# Patient Record
Sex: Female | Born: 1955
Health system: Southern US, Community
[De-identification: ages and names within clinical notes are randomized; demographics above are authoritative.]

## PROBLEM LIST (undated history)

## (undated) DIAGNOSIS — I10 Essential (primary) hypertension: Secondary | ICD-10-CM

## (undated) DIAGNOSIS — H269 Unspecified cataract: Secondary | ICD-10-CM

## (undated) DIAGNOSIS — E785 Hyperlipidemia, unspecified: Secondary | ICD-10-CM

## (undated) DIAGNOSIS — Z9889 Other specified postprocedural states: Secondary | ICD-10-CM

## (undated) DIAGNOSIS — C801 Malignant (primary) neoplasm, unspecified: Secondary | ICD-10-CM

## (undated) DIAGNOSIS — R112 Nausea with vomiting, unspecified: Secondary | ICD-10-CM

## (undated) DIAGNOSIS — Z923 Personal history of irradiation: Secondary | ICD-10-CM

## (undated) DIAGNOSIS — T4145XA Adverse effect of unspecified anesthetic, initial encounter: Secondary | ICD-10-CM

## (undated) DIAGNOSIS — T8859XA Other complications of anesthesia, initial encounter: Secondary | ICD-10-CM

## (undated) HISTORY — DX: Unspecified cataract: H26.9

## (undated) HISTORY — DX: Hyperlipidemia, unspecified: E78.5

## (undated) HISTORY — PX: MULTIPLE TOOTH EXTRACTIONS: SHX2053

---

## 2002-05-02 ENCOUNTER — Ambulatory Visit (HOSPITAL_COMMUNITY): Admission: RE | Admit: 2002-05-02 | Discharge: 2002-05-02 | Payer: Self-pay | Admitting: Family Medicine

## 2002-05-02 ENCOUNTER — Encounter: Payer: Self-pay | Admitting: Family Medicine

## 2003-01-17 ENCOUNTER — Emergency Department (HOSPITAL_COMMUNITY): Admission: EM | Admit: 2003-01-17 | Discharge: 2003-01-17 | Payer: Self-pay | Admitting: Emergency Medicine

## 2005-02-07 ENCOUNTER — Ambulatory Visit (HOSPITAL_COMMUNITY): Admission: RE | Admit: 2005-02-07 | Discharge: 2005-02-07 | Payer: Self-pay | Admitting: Family Medicine

## 2006-01-24 HISTORY — PX: FRACTURE SURGERY: SHX138

## 2006-10-11 ENCOUNTER — Ambulatory Visit (HOSPITAL_COMMUNITY): Admission: RE | Admit: 2006-10-11 | Discharge: 2006-10-11 | Payer: Self-pay | Admitting: Family Medicine

## 2006-10-11 ENCOUNTER — Ambulatory Visit (HOSPITAL_COMMUNITY): Admission: RE | Admit: 2006-10-11 | Discharge: 2006-10-11 | Payer: Self-pay | Admitting: Orthopaedic Surgery

## 2006-10-14 ENCOUNTER — Inpatient Hospital Stay (HOSPITAL_COMMUNITY): Admission: RE | Admit: 2006-10-14 | Discharge: 2006-10-16 | Payer: Self-pay | Admitting: Orthopedic Surgery

## 2010-06-08 NOTE — Op Note (Signed)
NAMEARMINDA, FOGLIO              ACCOUNT NO.:  [id number]   MEDICAL RECORD NO.:  [id number]          PATIENT TYPE:  OIB   LOCATION:  1604                         FACILITY:  [facility]   PHYSICIAN:  Ollen Gross, M.D.    DATE OF BIRTH:  Feb 22, 1955   DATE OF PROCEDURE:  10/13/2006  DATE OF DISCHARGE:                               OPERATIVE REPORT   PREOPERATIVE DIAGNOSIS:  Right medial tibial plateau fracture.   POSTOPERATIVE DIAGNOSIS:  Right medial tibial plateau fracture.   PROCEDURE:  Open reduction and internal fixation of tibial plateau  fracture, right.   SURGEON:  Ollen Gross, M.D.   ASSISTANT:  Alexzandrew L. Perkins, P.A.C.   ANESTHESIA:  General.   ESTIMATED BLOOD LOSS:  Minimal.   DRAINS:  Hemovac x1.   TOURNIQUET TIME:  91 minutes at 300 mmHg.   COMPLICATIONS:  None.   CONDITION:  Stable to recovery.   BRIEF CLINICAL NOTE:  Ms. Kowal is a 55 year old female who had a fall  approximately two days ago with immediate pain and deformity of her  right knee.  She was seen at [facility] where x-rays and CT  scan showed medial side tibial plateau fracture.  I saw her in the  office yesterday and she is prepared for surgery today for open  reduction and internal fixation with bone grafting.   PROCEDURE IN DETAIL:  After successful administration of general  anesthetic, a tourniquet was placed high on the right thigh and the  right lower extremity prepped and draped in the usual sterile fashion.  A longitudinal incision is made starting at the mid patella coursing  distally over the tibial tubercle.  The skin is cut with a 10 blade  through subcutaneous tissue to the level of the extensor mechanism.  I  cut just medial to the patellar tendon to inspect the joint but did not  cut the meniscus and subperiosteally elevated the periosteum and soft  tissue of the proximal medial tibia to the posterior aspect.  I then  identified the depressed portion of the  fracture and through a small  open window in the cortex, was able to elevate the depressed portion and  we packed about 10 mL of cancellus graft mixed with the Symphony  platelet rich gel.  This effectively restored the joint line.  Then, I  attempted to place an Ace locking plate, but the contour of the plate  did not anywhere near match the contour of the proximal medial tibia,  even new using the left sided plate which would have been the left  lateral to hope to match the contour of the right medial but it did not  match it well.  We thus used the Synthes three hole locking plate and it  better matched the contour.  Proximally, we placed two locking screws  which helped to buttress up the elevated portion of the tibia.  Distally, I placed a cortical screw from medial to lateral and also  placed another locking screw.  This had great reduction of the fracture  and looked anatomic on multiple views on the  x-rays.  We placed it  through a range of motion and I was able to palpate the joint line,  there was no depression at the joint.  I then copiously irrigated the  joint and the wound with saline solution.  The periosteum was closed  over the plate with interrupted #1 Vicryl.  The tourniquet was released  with a total time of 91 minutes.  The deep tissues were closed with  interrupted #1 Vicryl, subcu with interrupted 2-0 Vicryl, and the skin  with staples.  A Hemovac drain had been placed and it was hooked to  suction.  A bulky sterile dressing was applied.  She was placed into a  knee immobilizer, awakened, and transferred to recovery in stable  condition.      Ollen Gross, M.D.  Electronically Signed     FA/MEDQ  D:  10/13/2006  T:  10/14/2006  Job:  [id number]

## 2010-06-11 NOTE — H&P (Signed)
Meredith Donaldson, Meredith Donaldson              ACCOUNT NO.:  [id number]   MEDICAL RECORD NO.:  [id number]          PATIENT TYPE:  INP   LOCATION:  1604                         FACILITY:  [facility]   PHYSICIAN:  Ollen Gross, M.D.    DATE OF BIRTH:  1955-03-29   DATE OF ADMISSION:  10/13/2006  DATE OF DISCHARGE:  10/16/2006                              HISTORY & PHYSICAL   CHIEF COMPLAINT:  Right leg pain.   HISTORY OF PRESENT ILLNESS:  The patient is a 55 year old who had a fall  two days prior to admission, immediate pain and deformity of the right  knee.  She was seen at [facility] where x-rays and CT scan,  found to have medial side tibial plateau.  She was seen in the office on  the day prior to admission by Dr. Lequita Halt.  Planned for surgery and  admitted for ORIF tibial plateau fracture.   ALLERGIES:  SULFA and PENICILLIN.   CURRENT MEDICATIONS:  Ziac, Colace, K-Dur.   PAST HISTORY:  1. Hypertension.  2. Arthritis.  3. Anemia.  4. Tension headaches.   PAST SURGICAL HISTORY:  Noncontributory.   FAMILY HISTORY:  Negative.   SOCIAL HISTORY:  Does not use tobacco products or alcohol products.   REVIEW OF SYSTEMS:  GENERAL:  No fever, chills, night sweats.  NEUROLOGIC:  No seizures, syncope or paralysis.  RESPIRATORY:  No  shortness breath, productive cough or hemoptysis.  CARDIOVASCULAR:  No  chest pain, angina or orthopnea.  GI:  No nausea, vomiting, diarrhea,  constipation.  GU: No dysuria, hematuria or discharge. MUSCULOSKELETAL:  Right leg.   PHYSICAL EXAMINATION:  VITAL SIGNS:  Temperature 98.2, pulse 57,  respiration 12, blood pressure 124/60.  GENERAL:  A 55 year old female well-nourished, well-developed, no acute  distress, mild distress secondary to right leg pain.  HEENT:  Normocephalic, atraumatic.  Pupils round and reactive.  Notable  glasses.  EOMs intact.  NECK:  Supple.  CHEST: Clear.  HEART: Regular rhythm.  No murmur, S1-S2.  ABDOMEN:  Soft, nontender.   Bowel sounds present.  RECTAL/BREASTS/GENITALIA:  Not done, not pertinent to present illness.  EXTREMITIES:  Right leg noted to be in a knee immobilizer secondary to  fracture.  Motor function is intact.  Moved foot and toes well on exam.  Swelling in the knee.   IMPRESSION:  Right medial tibial plateau fracture.   PLAN:  The patient admitted to [facility] to undergo a ORIF of  the right tibia plateau.  Surgery will be performed by Dr. Ollen Gross.      Alexzandrew L. Perkins, P.A.C.      Ollen Gross, M.D.  Electronically Signed    ALP/MEDQ  D:  11/14/2006  T:  11/14/2006  Job:  [id number]   cc:   Chart

## 2010-06-11 NOTE — Discharge Summary (Signed)
Meredith Donaldson, Meredith Donaldson              ACCOUNT NO.:  [id number]   MEDICAL RECORD NO.:  [id number]          PATIENT TYPE:  INP   LOCATION:  1604                         [facility]ACILITY:  [facility]   PHYSICIAN:  Ollen Gross, M.D.    DATE OF BIRTH:  [birth year]   DATE OF ADMISSION:  10/13/2006  DATE OF DISCHARGE:  10/16/2006                               DISCHARGE SUMMARY   ADMITTING DIAGNOSES:  1. Right medial tibial plateau fracture.  2. Hypertension.  3. Arthritis.  4. History of anemia.  5. Tension headaches.   DISCHARGE DIAGNOSES:  1. Right medial tibial plateau fracture status post open reduction and      internal fixation right tibia plateau.  2. Hypertension.  3. Arthritis.  4. History of anemia.  5. Tension headaches.  6. Postoperative blood loss anemia.  7. Mild hypokalemia, improved.  8. Mild postoperative hyponatremia, improved.   PROCEDURE:  October 13, 2006, ORIF right tibial plateau.  Surgeon:  Dr. Lequita Halt.  Assistant:  Avel Peace PA-C.  Anesthesia:  General.   BRIEF HISTORY:  Meredith Donaldson is a 55 year old female who had a fall  approximately two days ago, seen at [facility], x-ray and CT  scan, was seen in the office at follow up and planned for surgery.   LABORATORY DATA:  Preop H&H 12.1 and 35.3.  Serial CBCs were followed.  Hemoglobin did drop to 9.6, last noted 8.9 and 25.7.  PT and PTT on  admission 14.4 and 37, respectively, INR 1.1.  Serial protimes followed.  Last noted PT/INR 15.1 and 1.2.  BMET on admission with low potassium at  3.3, remaining BMP within normal limits.  Serial BMETs were followed.  Potassium came up to 4.1, sodium dropped from 137 to 132 and back up to  137.   EKG October 13, 2006, normal sinus rhythm, unconfirmed.   A two-view knee October 13, 2006, status post placement of __________  screw hardware for reduction of medial tibial plateau.  Two-view chest  October 13, 2006, no active cardiopulmonary disease.   HOSPITAL  COURSE:  The patient was admitted to [facility] and  taken OR, underwent above stated procedure without complication.  The  patient tolerated the procedure well, later transferred to the recovery  room and orthopedic floor, started on PCA and p.o. analgesic pain  control following surgery. Status improved well on the morning of day  #1, comfortable with PCA.  She was weaned over p.o. meds.  Started a CPM  for passive range of motion.  She had a little bit of hypokalemia on  admission.  She was given p.o. supplementation.  Started getting up out  of bed by day #2.  She was starting around a little bit better with  therapy.  She was maintaining her touchdown weightbearing.  She started  on Coumadin protocol.  She had a little bit of drop in her sodium that  was felt to be due to the fluids.  Potassium still a little low.  We  discontinued her fluids.  She was on Lovenox along with Coumadin.  Started getting up and around.  By day #3, she was getting up better  with PT, walking short distances maintaining her touchdown weightbearing  and she was ready go home.  Hemoglobin was a little low so we sent her  home on some iron.  She was not quite therapeutic, so we sent home on  Lovenox and Coumadin.   DISCHARGE/PLAN:  1. The patient discharged home on October 16, 2006.  2. Discharge diagnoses:  Please see above.  3. Discharge medications:  Vicodin, Robaxin, Nu-Iron Lovenox Coumadin.   DIET:  As tolerated.   ACTIVITY:  She is touchdown weightbearing right leg.  May start  showering.  Do not submerge the incision under water.  Home health PT,  home health nursing, Coumadin protocol.   FOLLOW-UP:  Next week on October 24, 2006, with Dr. Lequita Halt.  Call for  an appointment time.   DISPOSITION:  Home.   CONDITION ON DISCHARGE:  Improving.      Alexzandrew L. Perkins, P.A.C.      Ollen Gross, M.D.  Electronically Signed    ALP/MEDQ  D:  11/14/2006  T:  11/14/2006   Job:  [id number]   cc:   Chart

## 2010-11-04 LAB — PROTIME-INR
INR: 1.1
INR: 1.1
INR: 1.2
INR: 1.2
Prothrombin Time: 14.4
Prothrombin Time: 14.6
Prothrombin Time: 15.1
Prothrombin Time: 15.7 — ABNORMAL HIGH

## 2010-11-04 LAB — CBC
HCT: 25.7 — ABNORMAL LOW
HCT: 27.5 — ABNORMAL LOW
Hemoglobin: 8.9 — ABNORMAL LOW
Hemoglobin: 9.6 — ABNORMAL LOW
MCHC: 34.6
MCHC: 35
MCV: 93.4
MCV: 93.7
Platelets: 153
Platelets: 172
RBC: 2.74 — ABNORMAL LOW
RBC: 2.95 — ABNORMAL LOW
RDW: 12.2
RDW: 12.3
WBC: 10.1
WBC: 10.9 — ABNORMAL HIGH

## 2010-11-04 LAB — BASIC METABOLIC PANEL
BUN: 10
BUN: 3 — ABNORMAL LOW
BUN: 6
BUN: 8
CO2: 27
CO2: 30
CO2: 30
CO2: 31
Calcium: 8.4
Calcium: 8.4
Calcium: 8.7
Calcium: 9.2
Chloride: 100
Chloride: 101
Chloride: 101
Chloride: 97
Creatinine, Ser: 0.85
Creatinine, Ser: 0.89
Creatinine, Ser: 0.89
Creatinine, Ser: 0.99
GFR calc Af Amer: >60
GFR calc Af Amer: >60
GFR calc Af Amer: >60
GFR calc Af Amer: >60
GFR calc non Af Amer: 59 — ABNORMAL LOW
GFR calc non Af Amer: >60
GFR calc non Af Amer: >60
GFR calc non Af Amer: >60
Glucose, Bld: 110 — ABNORMAL HIGH
Glucose, Bld: 118 — ABNORMAL HIGH
Glucose, Bld: 132 — ABNORMAL HIGH
Glucose, Bld: 136 — ABNORMAL HIGH
Potassium: 3.3 — ABNORMAL LOW
Potassium: 3.4 — ABNORMAL LOW
Potassium: 3.4 — ABNORMAL LOW
Potassium: 4.1
Sodium: 132 — ABNORMAL LOW
Sodium: 136
Sodium: 137
Sodium: 137

## 2010-11-04 LAB — HEMOGLOBIN AND HEMATOCRIT, BLOOD
HCT: 35.3 — ABNORMAL LOW
Hemoglobin: 12.1

## 2010-11-04 LAB — APTT: aPTT: 37

## 2011-10-20 ENCOUNTER — Other Ambulatory Visit (HOSPITAL_COMMUNITY): Payer: Self-pay | Admitting: Family Medicine

## 2011-10-20 ENCOUNTER — Ambulatory Visit (HOSPITAL_COMMUNITY)
Admission: RE | Admit: 2011-10-20 | Discharge: 2011-10-20 | Disposition: A | Discharge status: Home / Self Care | Payer: Managed Care, Other (non HMO) | Source: Ambulatory Visit | Attending: Family Medicine | Admitting: Family Medicine

## 2011-10-20 DIAGNOSIS — M76899 Other specified enthesopathies of unspecified lower limb, excluding foot: Secondary | ICD-10-CM

## 2011-10-20 DIAGNOSIS — M949 Disorder of cartilage, unspecified: Secondary | ICD-10-CM | POA: Insufficient documentation

## 2011-10-20 DIAGNOSIS — M25569 Pain in unspecified knee: Secondary | ICD-10-CM | POA: Insufficient documentation

## 2011-10-20 DIAGNOSIS — M899 Disorder of bone, unspecified: Secondary | ICD-10-CM | POA: Insufficient documentation

## 2014-09-17 ENCOUNTER — Other Ambulatory Visit (HOSPITAL_COMMUNITY): Payer: Self-pay | Admitting: Physician Assistant

## 2014-09-17 DIAGNOSIS — Z139 Encounter for screening, unspecified: Secondary | ICD-10-CM

## 2014-09-24 ENCOUNTER — Ambulatory Visit (HOSPITAL_COMMUNITY)
Admission: RE | Admit: 2014-09-24 | Discharge: 2014-09-24 | Disposition: A | Discharge status: Home / Self Care | Payer: BLUE CROSS/BLUE SHIELD | Source: Ambulatory Visit | Attending: Physician Assistant | Admitting: Physician Assistant

## 2014-09-24 DIAGNOSIS — Z139 Encounter for screening, unspecified: Secondary | ICD-10-CM

## 2014-09-24 DIAGNOSIS — Z1231 Encounter for screening mammogram for malignant neoplasm of breast: Secondary | ICD-10-CM | POA: Diagnosis present

## 2015-06-19 ENCOUNTER — Ambulatory Visit (HOSPITAL_COMMUNITY)
Admission: RE | Admit: 2015-06-19 | Discharge: 2015-06-19 | Disposition: A | Discharge status: Home / Self Care | Payer: BLUE CROSS/BLUE SHIELD | Source: Ambulatory Visit | Attending: Family Medicine | Admitting: Family Medicine

## 2015-06-19 ENCOUNTER — Other Ambulatory Visit (HOSPITAL_COMMUNITY): Payer: Self-pay | Admitting: Family Medicine

## 2015-06-19 DIAGNOSIS — S8991XA Unspecified injury of right lower leg, initial encounter: Secondary | ICD-10-CM | POA: Diagnosis not present

## 2015-06-19 DIAGNOSIS — M25561 Pain in right knee: Secondary | ICD-10-CM | POA: Diagnosis not present

## 2015-06-19 DIAGNOSIS — Z6838 Body mass index (BMI) 38.0-38.9, adult: Secondary | ICD-10-CM | POA: Diagnosis not present

## 2015-06-19 DIAGNOSIS — Z1389 Encounter for screening for other disorder: Secondary | ICD-10-CM | POA: Diagnosis not present

## 2015-08-25 DIAGNOSIS — M25561 Pain in right knee: Secondary | ICD-10-CM | POA: Diagnosis not present

## 2015-11-20 DIAGNOSIS — Z23 Encounter for immunization: Secondary | ICD-10-CM | POA: Diagnosis not present

## 2016-01-09 ENCOUNTER — Emergency Department (HOSPITAL_COMMUNITY)
Admission: EM | Admit: 2016-01-09 | Discharge: 2016-01-09 | Disposition: A | Discharge status: Home / Self Care | Payer: BLUE CROSS/BLUE SHIELD | Attending: Emergency Medicine | Admitting: Emergency Medicine

## 2016-01-09 ENCOUNTER — Encounter (HOSPITAL_COMMUNITY): Payer: Self-pay | Admitting: Emergency Medicine

## 2016-01-09 ENCOUNTER — Emergency Department (HOSPITAL_COMMUNITY): Payer: BLUE CROSS/BLUE SHIELD

## 2016-01-09 DIAGNOSIS — I10 Essential (primary) hypertension: Secondary | ICD-10-CM | POA: Insufficient documentation

## 2016-01-09 DIAGNOSIS — Y939 Activity, unspecified: Secondary | ICD-10-CM | POA: Diagnosis not present

## 2016-01-09 DIAGNOSIS — W01198A Fall on same level from slipping, tripping and stumbling with subsequent striking against other object, initial encounter: Secondary | ICD-10-CM | POA: Insufficient documentation

## 2016-01-09 DIAGNOSIS — Y92009 Unspecified place in unspecified non-institutional (private) residence as the place of occurrence of the external cause: Secondary | ICD-10-CM | POA: Insufficient documentation

## 2016-01-09 DIAGNOSIS — J9811 Atelectasis: Secondary | ICD-10-CM

## 2016-01-09 DIAGNOSIS — Z79899 Other long term (current) drug therapy: Secondary | ICD-10-CM | POA: Diagnosis not present

## 2016-01-09 DIAGNOSIS — W19XXXA Unspecified fall, initial encounter: Secondary | ICD-10-CM

## 2016-01-09 DIAGNOSIS — S2231XA Fracture of one rib, right side, initial encounter for closed fracture: Secondary | ICD-10-CM | POA: Diagnosis not present

## 2016-01-09 DIAGNOSIS — S299XXA Unspecified injury of thorax, initial encounter: Secondary | ICD-10-CM | POA: Diagnosis present

## 2016-01-09 DIAGNOSIS — Y999 Unspecified external cause status: Secondary | ICD-10-CM | POA: Diagnosis not present

## 2016-01-09 HISTORY — DX: Essential (primary) hypertension: I10

## 2016-01-09 LAB — COMPREHENSIVE METABOLIC PANEL
ALK PHOS: 54 U/L (ref 38–126)
ALT: 14 U/L (ref 14–54)
AST: 17 U/L (ref 15–41)
Albumin: 3.9 g/dL (ref 3.5–5.0)
Anion gap: 7 (ref 5–15)
BILIRUBIN TOTAL: 1.6 mg/dL — AB (ref 0.3–1.2)
BUN: 12 mg/dL (ref 6–20)
CALCIUM: 9.1 mg/dL (ref 8.9–10.3)
CHLORIDE: 106 mmol/L (ref 101–111)
CO2: 27 mmol/L (ref 22–32)
CREATININE: 0.77 mg/dL (ref 0.44–1.00)
Glucose, Bld: 108 mg/dL — ABNORMAL HIGH (ref 65–99)
Potassium: 3.9 mmol/L (ref 3.5–5.1)
Sodium: 140 mmol/L (ref 135–145)
Total Protein: 7.3 g/dL (ref 6.5–8.1)

## 2016-01-09 LAB — CBC WITH DIFFERENTIAL/PLATELET
BASOS ABS: 0.1 10*3/uL (ref 0.0–0.1)
Basophils Relative: 1 %
EOS PCT: 1 %
Eosinophils Absolute: 0.1 10*3/uL (ref 0.0–0.7)
HEMATOCRIT: 41.3 % (ref 36.0–46.0)
HEMOGLOBIN: 13.8 g/dL (ref 12.0–15.0)
LYMPHS ABS: 2.4 10*3/uL (ref 0.7–4.0)
LYMPHS PCT: 25 %
MCH: 32.8 pg (ref 26.0–34.0)
MCHC: 33.4 g/dL (ref 30.0–36.0)
MCV: 98.1 fL (ref 78.0–100.0)
Monocytes Absolute: 0.7 10*3/uL (ref 0.1–1.0)
Monocytes Relative: 8 %
NEUTROS ABS: 6.5 10*3/uL (ref 1.7–7.7)
Neutrophils Relative %: 67 %
Platelets: 238 10*3/uL (ref 150–400)
RBC: 4.21 MIL/uL (ref 3.87–5.11)
RDW: 13.1 % (ref 11.5–15.5)
WBC: 9.7 10*3/uL (ref 4.0–10.5)

## 2016-01-09 LAB — URINALYSIS, ROUTINE W REFLEX MICROSCOPIC
Bilirubin Urine: NEGATIVE
GLUCOSE, UA: NEGATIVE mg/dL
HGB URINE DIPSTICK: NEGATIVE
Ketones, ur: NEGATIVE mg/dL
LEUKOCYTES UA: NEGATIVE
Nitrite: NEGATIVE
PROTEIN: NEGATIVE mg/dL
SPECIFIC GRAVITY, URINE: 1.008 (ref 1.005–1.030)
pH: 5 (ref 5.0–8.0)

## 2016-01-09 LAB — LACTIC ACID, PLASMA: LACTIC ACID, VENOUS: 0.7 mmol/L (ref 0.5–1.9)

## 2016-01-09 MED ORDER — MORPHINE SULFATE (PF) 4 MG/ML IV SOLN
4.0000 mg | Freq: Once | INTRAVENOUS | Status: AC
  Administered 2016-01-09: 4 mg via INTRAVENOUS
  Filled 2016-01-09: qty 1

## 2016-01-09 MED ORDER — IOPAMIDOL (ISOVUE-300) INJECTION 61%
75.0000 mL | Freq: Once | INTRAVENOUS | Status: AC | PRN
  Administered 2016-01-09: 75 mL via INTRAVENOUS

## 2016-01-09 MED ORDER — OXYCODONE-ACETAMINOPHEN 5-325 MG PO TABS: 1.0000 | ORAL_TABLET | ORAL | 0 refills | Status: DC | PRN

## 2016-01-09 MED ORDER — ONDANSETRON HCL 4 MG PO TABS: 4.0000 mg | ORAL_TABLET | Freq: Three times a day (TID) | ORAL | 0 refills | Status: DC | PRN

## 2016-01-09 NOTE — ED Notes (Signed)
Pt verbalized understanding of no driving and to use caution within 4 hours of taking pain meds due to meds cause drowsiness 

## 2016-01-09 NOTE — ED Triage Notes (Signed)
Pt states she slipped in the bathtub on Tuesday and hit right ribs on side.  Began having pain yesterday afternoon.

## 2016-01-09 NOTE — Discharge Instructions (Signed)
Please take your pain medicine and nausea medicine as needed to control your pain. Please continue taking deep breaths. If you develop any worsening shortness of breath or pain, please return to the nearest emergency department for further management. You need to take deep breaths so you do not develop a pneumonia. He started having fevers or chills or other symptoms, please return. Please follow-up with a primary care physician in several days for further evaluation.

## 2016-01-09 NOTE — ED Provider Notes (Signed)
[facility] DEPT Provider Note   CSN: [id number] Arrival date & time: 01/09/16  0915  By signing my name below, I, Meredith Donaldson, attest that this documentation has been prepared under the direction and in the presence of Courtney Paris, MD. Electronically Signed: Sonum Donaldson, Education administrator. 01/09/16. 9:53 AM.  History   Chief Complaint Chief Complaint  Patient presents with  . Fall    right rib pain    The history is provided by the patient. No language interpreter was used.     HPI Comments: Meredith Donaldson is a 60 y.o. female who presents to the Emergency Department complaining for right chest wall pain from a fall that occurred 4 days ago. Patient states she was stepping out of the bathtub when she fell, striking her right side on the side of the tub. She denies head injury or LOC. She states initially there was soreness to the affected area but yesterday she began having pain. She reports having SOB with the pain. She states the pain is intermittent and is present with movement or lifting objects. She states the pain is 2/10 at rest and 10/10 with movement.  She denies associated bruising to the affected area. She denies CP, abdominal pain, nausea, vomiting, hematuria, constipation, diarrhea. She denies allergies to medications aside from sulfa or penicillin.   Past Medical History:  Diagnosis Date  . Hypertension     There are no active problems to display for this patient.   Past Surgical History:  Procedure Laterality Date  . CESAREAN SECTION    . FRACTURE SURGERY      OB History    No data available       Home Medications    Prior to Admission medications   Not on File    Family History History reviewed. No pertinent family history.  Social History Social History  Substance Use Topics  . Smoking status: Never Smoker  . Smokeless tobacco: Not on file  . Alcohol use No     Allergies   Penicillins and Sulfa antibiotics   Review of  Systems Review of Systems  Respiratory: Negative for shortness of breath.   Cardiovascular: Negative for chest pain.  Gastrointestinal: Negative for abdominal pain, constipation, nausea and vomiting.  Genitourinary: Negative for hematuria.  Musculoskeletal: Positive for arthralgias and myalgias.  Neurological: Negative for syncope.  All other systems reviewed and are negative.    Physical Exam Updated Vital Signs BP 167/88 (BP Location: Left Arm)   Pulse 70   Temp 98.1 F (36.7 C) (Oral)   Resp 18   Ht 5\' 8"  (1.727 m)   Wt 230 lb (104.3 kg)   SpO2 100%   BMI 34.97 kg/m   Physical Exam  Constitutional: She is oriented to person, place, and time. She appears well-developed and well-nourished. No distress.  HENT:  Head: Normocephalic and atraumatic.  Right Ear: External ear normal.  Left Ear: External ear normal.  Nose: Nose normal.  Mouth/Throat: Oropharynx is clear and moist. No oropharyngeal exudate.  Eyes: Conjunctivae and EOM are normal. Pupils are equal, round, and reactive to light.  Neck: Normal range of motion. Neck supple.  Cardiovascular: Normal rate, regular rhythm and normal heart sounds.   Pulmonary/Chest: Effort normal and breath sounds normal. No stridor. No respiratory distress. She has no rales. She exhibits tenderness. She exhibits no crepitus, no deformity, no swelling and no retraction.    Breath sounds are symmetric bilaterally. No crackles. No crepitus. Tenderness to right lateral chest.  Abdominal: Soft. She exhibits no distension. There is no tenderness. There is no rebound.  Neurological: She is alert and oriented to person, place, and time. She has normal reflexes. No cranial nerve deficit or sensory deficit. She exhibits normal muscle tone. Coordination normal.  Skin: Skin is warm. No rash noted. She is not diaphoretic. No erythema.  Nursing note and vitals reviewed.    ED Treatments / Results  DIAGNOSTIC STUDIES: Oxygen Saturation is 100% on  RA, normal by my interpretation.    COORDINATION OF CARE: 9:50 AM Discussed treatment plan with pt at bedside and pt agreed to plan.    Labs (all labs ordered are listed, but only abnormal results are displayed) Labs Reviewed  COMPREHENSIVE METABOLIC PANEL - Abnormal; Notable for the following:       Result Value   Glucose, Bld 108 (*)    Total Bilirubin 1.6 (*)    All other components within normal limits  CBC WITH DIFFERENTIAL/PLATELET  URINALYSIS, ROUTINE W REFLEX MICROSCOPIC  LACTIC ACID, PLASMA  LACTIC ACID, PLASMA    EKG  EKG Interpretation None       Radiology Dg Ribs Unilateral W/chest Right  Result Date: 01/09/2016 CLINICAL DATA:  Right anterior, lower rib pain following a fall in the shower 2 days ago. EXAM: RIGHT RIBS AND CHEST - 3+ VIEW COMPARISON:  Chest radiographs dated 10/13/2006. FINDINGS: Normal sized heart. Poor inspiration with bibasilar atelectasis. Mildly displaced seventh anterolateral rib fracture and possible nondisplaced right ninth posterolateral rib fracture. No pneumothorax. Small right pleural effusion. IMPRESSION: 1. Mildly displaced right seventh rib fracture and possible nondisplaced right ninth rib fracture. 2. No pneumothorax. 3. Poor inspiration with bibasilar atelectasis. 4. Small right pleural effusion. Electronically Signed   By: Claudie Revering M.D.   On: 01/09/2016 10:32   Ct Chest W Contrast  Result Date: 01/09/2016 CLINICAL DATA:  Emergency Department complaining of a fall that occurred 4 days ago. Patient states she was stepping out of the bathtub when she fell, striking her right side on the side of the tub. She denies head injury or LOC. c/o RT rib pain, fx ribs 7 and 9 on RT; SOB, hx HTN EXAM: CT CHEST WITH CONTRAST TECHNIQUE: Multidetector CT imaging of the chest was performed during intravenous contrast administration. CONTRAST:  36mL ISOVUE-300 IOPAMIDOL (ISOVUE-300) INJECTION 61% COMPARISON:  Radiographs from earlier the same day  FINDINGS: Cardiovascular: No significant vascular findings. Normal heart size. No pericardial effusion. Mediastinum/Nodes: No enlarged mediastinal, hilar, or axillary lymph nodes. Thyroid gland, trachea, and esophagus demonstrate no significant findings. Lungs/Pleura: No pneumothorax. Trace right pleural effusion. Atelectasis/ consolidation in medial and posterior basal segments right lower lobe. Platelike atelectasis in the right middle lobe anteriorly. Patchy subsegmental dependent atelectasis posteriorly in both lung bases. Upper Abdomen: No acute abnormality. Probable exophytic subcentimeter cyst, lower pole left kidney. Musculoskeletal: Minimally displaced fracture, Right lateral aspect right seventh rib. Minimal spondylitic changes in the lower thoracic spine. Sternum intact. IMPRESSION: 1. Minimally displaced fracture, anterolateral aspect right seventh rib. 2. No pneumothorax. 3. Patchy atelectasis/consolidation in the right middle and lower lobes, with trace pleural effusion. Electronically Signed   By: Lucrezia Europe M.D.   On: 01/09/2016 13:23    Procedures Procedures (including critical care time)  Medications Ordered in ED Medications  morphine 4 MG/ML injection 4 mg (4 mg Intravenous Given 01/09/16 1056)  iopamidol (ISOVUE-300) 61 % injection 75 mL (75 mLs Intravenous Contrast Given 01/09/16 1243)     Initial Impression / Assessment and Plan /  ED Course  I have reviewed the triage vital signs and the nursing notes.  Pertinent labs & imaging results that were available during my care of the patient were reviewed by me and considered in my medical decision making (see chart for details).  Clinical Course    Meredith Donaldson is a 60 y.o. female who presents to the Emergency Department complaining for right chest wall pain from a fall.   History and exam are seen above.  On exam, patient had right lateral chest wall tenderness. Patient had no back tenderness. Patient's breath sounds  were equal and not diminished. No initial concern for pneumothorax. Chest was otherwise nontender and abdomen was nontender. Patient had no evidence of head trauma or neck trauma. Pulses were symmetrical bilaterally and patient had no focal neurologic deficits.  Based on pain  location, x-rays were ordered. Evidence of rib fracture was seen. Patient had laboratory testing and CT was ordered to further evaluate for pulmonary contusion.  CT showed evidence of seventh rib fracture with no evidence of contusion. There was some atelectasis and a small effusion.  Patient felt much better after pain medications.  Patient remained on room air and had no hypoxia, tachycardia, or other vital sign abnormalities. Patient will be given prescription for pain medicine, nausea medicine, and will follow-up with her PCP in several days. Patient given strict return precautions for new or worsening symptoms including worsening pain or shortness of breath. Patient counseled on need to take deep breaths to prevent pneumonia. Patient given incentive spirometer and instructed by respiratory how to use this. Patient reports she has used one before after a leg surgery.  Patient understood return precautions and follow-up instructions. Patient and her fianc were discharged in good condition with improvement in her presenting pain.    Final Clinical Impressions(s) / ED Diagnoses   Final diagnoses:  Closed fracture of one rib of right side, initial encounter  Atelectasis of right lung  Fall in home, initial encounter    New Prescriptions New Prescriptions   ONDANSETRON (ZOFRAN) 4 MG TABLET    Take 1 tablet (4 mg total) by mouth every 8 (eight) hours as needed for nausea or vomiting.   OXYCODONE-ACETAMINOPHEN (PERCOCET/ROXICET) 5-325 MG TABLET    Take 1 tablet by mouth every 4 (four) hours as needed for severe pain.   I personally performed the services described in this documentation, which was scribed in my  presence. The recorded information has been reviewed and is accurate.   Clinical Impression: 1. Closed fracture of one rib of right side, initial encounter   2. Atelectasis of right lung   3. Fall in home, initial encounter     Disposition: Discharge  Condition: Good  I have discussed the results, Dx and Tx plan with the pt(& family if present). He/she/they expressed understanding and agree(s) with the plan. Discharge instructions discussed at great length. Strict return precautions discussed and pt &/or family have verbalized understanding of the instructions. No further questions at time of discharge.    New Prescriptions   ONDANSETRON (ZOFRAN) 4 MG TABLET    Take 1 tablet (4 mg total) by mouth every 8 (eight) hours as needed for nausea or vomiting.   OXYCODONE-ACETAMINOPHEN (PERCOCET/ROXICET) 5-325 MG TABLET    Take 1 tablet by mouth every 4 (four) hours as needed for severe pain.    Follow Up: Redmond School, MD [place] [phone]     [place] [phone]  [place] [phone] Schedule an appointment as soon as possible for a visit       Courtney Paris, MD 01/09/16 [phone]

## 2016-01-09 NOTE — ED Notes (Signed)
Per lab-blood work to be recollected due to hemolysis.

## 2016-01-09 NOTE — ED Notes (Signed)
Patient transported to CT 

## 2016-01-13 DIAGNOSIS — Z6839 Body mass index (BMI) 39.0-39.9, adult: Secondary | ICD-10-CM | POA: Diagnosis not present

## 2016-01-13 DIAGNOSIS — Z1389 Encounter for screening for other disorder: Secondary | ICD-10-CM | POA: Diagnosis not present

## 2016-01-13 DIAGNOSIS — Z4789 Encounter for other orthopedic aftercare: Secondary | ICD-10-CM | POA: Diagnosis not present

## 2016-05-20 DIAGNOSIS — I10 Essential (primary) hypertension: Secondary | ICD-10-CM | POA: Diagnosis not present

## 2016-05-20 DIAGNOSIS — Z6837 Body mass index (BMI) 37.0-37.9, adult: Secondary | ICD-10-CM | POA: Diagnosis not present

## 2017-01-31 DIAGNOSIS — H524 Presbyopia: Secondary | ICD-10-CM | POA: Diagnosis not present

## 2017-01-31 DIAGNOSIS — H52223 Regular astigmatism, bilateral: Secondary | ICD-10-CM | POA: Diagnosis not present

## 2017-01-31 DIAGNOSIS — H5213 Myopia, bilateral: Secondary | ICD-10-CM | POA: Diagnosis not present

## 2017-02-23 DIAGNOSIS — Z0001 Encounter for general adult medical examination with abnormal findings: Secondary | ICD-10-CM | POA: Diagnosis not present

## 2017-02-23 DIAGNOSIS — E782 Mixed hyperlipidemia: Secondary | ICD-10-CM | POA: Diagnosis not present

## 2017-02-23 DIAGNOSIS — Z1389 Encounter for screening for other disorder: Secondary | ICD-10-CM | POA: Diagnosis not present

## 2017-02-23 DIAGNOSIS — I10 Essential (primary) hypertension: Secondary | ICD-10-CM | POA: Diagnosis not present

## 2017-02-27 ENCOUNTER — Other Ambulatory Visit (HOSPITAL_COMMUNITY): Payer: Self-pay | Admitting: Family Medicine

## 2017-02-27 DIAGNOSIS — Z1231 Encounter for screening mammogram for malignant neoplasm of breast: Secondary | ICD-10-CM

## 2017-03-03 ENCOUNTER — Ambulatory Visit (HOSPITAL_COMMUNITY)
Admission: RE | Admit: 2017-03-03 | Discharge: 2017-03-03 | Disposition: A | Discharge status: Home / Self Care | Payer: BLUE CROSS/BLUE SHIELD | Source: Ambulatory Visit | Attending: Family Medicine | Admitting: Family Medicine

## 2017-03-03 DIAGNOSIS — Z1231 Encounter for screening mammogram for malignant neoplasm of breast: Secondary | ICD-10-CM | POA: Insufficient documentation

## 2017-03-07 ENCOUNTER — Other Ambulatory Visit (HOSPITAL_COMMUNITY): Payer: Self-pay | Admitting: Internal Medicine

## 2017-03-07 ENCOUNTER — Other Ambulatory Visit (HOSPITAL_COMMUNITY): Payer: Self-pay | Admitting: Family Medicine

## 2017-03-07 DIAGNOSIS — Z1211 Encounter for screening for malignant neoplasm of colon: Secondary | ICD-10-CM | POA: Diagnosis not present

## 2017-03-07 DIAGNOSIS — R928 Other abnormal and inconclusive findings on diagnostic imaging of breast: Secondary | ICD-10-CM

## 2017-03-14 ENCOUNTER — Ambulatory Visit (HOSPITAL_COMMUNITY)
Admission: RE | Admit: 2017-03-14 | Discharge: 2017-03-14 | Disposition: A | Discharge status: Home / Self Care | Payer: BLUE CROSS/BLUE SHIELD | Source: Ambulatory Visit | Attending: Family Medicine | Admitting: Family Medicine

## 2017-03-14 DIAGNOSIS — N6324 Unspecified lump in the left breast, lower inner quadrant: Secondary | ICD-10-CM | POA: Diagnosis not present

## 2017-03-14 DIAGNOSIS — R928 Other abnormal and inconclusive findings on diagnostic imaging of breast: Secondary | ICD-10-CM | POA: Insufficient documentation

## 2017-03-15 ENCOUNTER — Other Ambulatory Visit (HOSPITAL_COMMUNITY): Payer: Self-pay | Admitting: Family Medicine

## 2017-03-15 DIAGNOSIS — R928 Other abnormal and inconclusive findings on diagnostic imaging of breast: Secondary | ICD-10-CM

## 2017-03-15 DIAGNOSIS — N6324 Unspecified lump in the left breast, lower inner quadrant: Secondary | ICD-10-CM | POA: Diagnosis not present

## 2017-03-28 ENCOUNTER — Other Ambulatory Visit (HOSPITAL_COMMUNITY): Payer: Self-pay | Admitting: Internal Medicine

## 2017-03-28 ENCOUNTER — Ambulatory Visit (HOSPITAL_COMMUNITY)
Admission: RE | Admit: 2017-03-28 | Discharge: 2017-03-28 | Disposition: A | Discharge status: Home / Self Care | Payer: BLUE CROSS/BLUE SHIELD | Source: Ambulatory Visit | Attending: Internal Medicine | Admitting: Internal Medicine

## 2017-03-28 ENCOUNTER — Ambulatory Visit (HOSPITAL_COMMUNITY)
Admission: RE | Admit: 2017-03-28 | Discharge: 2017-03-28 | Disposition: A | Discharge status: Home / Self Care | Payer: BLUE CROSS/BLUE SHIELD | Source: Ambulatory Visit | Attending: Family Medicine | Admitting: Family Medicine

## 2017-03-28 ENCOUNTER — Encounter (HOSPITAL_COMMUNITY): Payer: Self-pay

## 2017-03-28 DIAGNOSIS — C50312 Malignant neoplasm of lower-inner quadrant of left female breast: Secondary | ICD-10-CM | POA: Diagnosis not present

## 2017-03-28 DIAGNOSIS — R928 Other abnormal and inconclusive findings on diagnostic imaging of breast: Secondary | ICD-10-CM

## 2017-03-28 DIAGNOSIS — N6324 Unspecified lump in the left breast, lower inner quadrant: Secondary | ICD-10-CM | POA: Diagnosis not present

## 2017-03-28 HISTORY — PX: BREAST BIOPSY: SHX20

## 2017-03-28 MED ORDER — LIDOCAINE HCL (PF) 1 % IJ SOLN
INTRAMUSCULAR | Status: AC
  Administered 2017-03-28: 8 mL
  Filled 2017-03-28: qty 15

## 2017-03-28 MED ORDER — LIDOCAINE-EPINEPHRINE (PF) 1 %-1:200000 IJ SOLN
INTRAMUSCULAR | Status: AC
  Filled 2017-03-28: qty 30

## 2017-04-04 ENCOUNTER — Other Ambulatory Visit: Payer: Self-pay | Admitting: General Surgery

## 2017-04-04 DIAGNOSIS — Z6836 Body mass index (BMI) 36.0-36.9, adult: Secondary | ICD-10-CM | POA: Diagnosis not present

## 2017-04-04 DIAGNOSIS — Z98891 History of uterine scar from previous surgery: Secondary | ICD-10-CM | POA: Diagnosis not present

## 2017-04-04 DIAGNOSIS — I10 Essential (primary) hypertension: Secondary | ICD-10-CM | POA: Diagnosis not present

## 2017-04-04 DIAGNOSIS — C50312 Malignant neoplasm of lower-inner quadrant of left female breast: Secondary | ICD-10-CM | POA: Diagnosis not present

## 2017-04-06 ENCOUNTER — Other Ambulatory Visit: Payer: Self-pay | Admitting: Surgery

## 2017-04-06 ENCOUNTER — Telehealth: Payer: Self-pay | Admitting: Oncology

## 2017-04-06 ENCOUNTER — Encounter: Payer: Self-pay | Admitting: Oncology

## 2017-04-06 DIAGNOSIS — C50312 Malignant neoplasm of lower-inner quadrant of left female breast: Secondary | ICD-10-CM

## 2017-04-06 NOTE — Telephone Encounter (Signed)
Appt has been scheduled for the pt to see Dr. Jana Hakim on 3/21 at 4pm. Pt aware to arrive 30 minutes early. Letter mailed.

## 2017-04-10 ENCOUNTER — Ambulatory Visit
Admission: RE | Admit: 2017-04-10 | Discharge: 2017-04-10 | Disposition: A | Discharge status: Home / Self Care | Payer: BLUE CROSS/BLUE SHIELD | Source: Ambulatory Visit | Attending: Surgery | Admitting: Surgery

## 2017-04-10 ENCOUNTER — Encounter: Payer: Self-pay | Admitting: Radiation Oncology

## 2017-04-10 DIAGNOSIS — C50912 Malignant neoplasm of unspecified site of left female breast: Secondary | ICD-10-CM | POA: Diagnosis not present

## 2017-04-10 DIAGNOSIS — C50312 Malignant neoplasm of lower-inner quadrant of left female breast: Secondary | ICD-10-CM

## 2017-04-10 MED ORDER — GADOBENATE DIMEGLUMINE 529 MG/ML IV SOLN
20.0000 mL | Freq: Once | INTRAVENOUS | Status: AC | PRN
  Administered 2017-04-10: 20 mL via INTRAVENOUS

## 2017-04-11 NOTE — Progress Notes (Signed)
Export  Telephone:([phone]) [phone] Fax:(336) [phone]     ID: Meredith Donaldson DOB: Jan 03, 1956  MR#: [id number]  PNT#:[id number]  Patient Care Team: Scherrie Bateman as PCP - General (Family Medicine) Magrinat, Virgie Dad, MD as Consulting Physician (Oncology) Meredith Skates, MD as Consulting Physician (General Surgery) Meredith Rudd, MD as Consulting Physician (Radiation Oncology) OTHER MD:   CHIEF COMPLAINT: Estrogen receptor positive breast cancer  CURRENT TREATMENT: Awaiting definitive surgery   HISTORY OF CURRENT ILLNESS: [name] had routine screening mammography on 03/03/2017 showing a possible abnormality in the left breast. She underwent unilateral left diagnostic mammography with tomography and left breast ultrasonography at [place] on 03/14/2017 showing: breast density category B. There are were multiple hypoechoic masses of varying sizes in the lower inner quadrant of the left breast found sonographically. A dominant mass at the 7:30 position measuring 0.9 x 0.9 x 0.8 cm near the inframammary fold. Another mass at the 7:30 slightly deep and lateral measuring 0.4 x 0.3 cm. At 8 o'clock position, near the medial margin of the breast is a second dominant mass measuring 0.8 x 0.8 x 0.9 cm. Slightly medial to this is a similar-appearing hypoechoic mass measuring 0.4 x 0.4 x 0.5 cm. Immediately lateral to the dominant mass at 8:00 position, located 12 cm from the nipple, is a 0.4 x 0.4 x 0.5 cm mass. Slightly more superficially at 8 o'clock position 12 cm from the nipple is an irregular hypoechoic mass measuring 0.7 cm maximum diameter. Ultrasound of the left axilla was negative for lymphadenopathy.   Accordingly on 03/28/2017 she proceeded to biopsy of the left breast area in question. The pathology from this procedure showed ([id number]): At the 8 o'clock position, invasive ductal carcinoma with papillary features. Prognostic indicators  significant for: estrogen receptor, 100% positive and progesterone receptor, 100% positive, both with strong staining intensity. Proliferation marker Ki67 at 2%. HER2 not amplified with ratios HER2/CEP17 signals 1.13 and average HER2 copies per cell 1.80  On 04/10/2017 the patient had bilateral breast MRI, showing an area up to 5.4 cm including 2 separate masses and an area of non-masslike enhancement.  The mass that was previously biopsied is the more posterior one, near the inframammary fold.  The more anterior mass has not been biopsied and if lumpectomy is considered biopsying that mass would allow the entire span of abnormal enhancement to be included in the lumpectomy specimen.  This second MRI guided biopsy has been scheduled for 04/17/2017.  The right breast was unremarkable and there were no abnormal looking lymph nodes  The patient's subsequent history is as detailed below.  INTERVAL HISTORY: Meredith Donaldson was evaluated in the breast cancer clinic on 04/13/2017 accompanied by her significant other, [name]. Her case was also presented at the multidisciplinary breast cancer conference on 04/12/2017. At that time a preliminary plan was proposed: It was felt she would most likely need a mastectomy.  Additional biopsies could be considered.  Plastics referrals was suggested.  The patient will need an Oncotype.  She will be a candidate for antiestrogens.  She may or may not need adjuvant radiation depending on the surgery chosen.   REVIEW OF SYSTEMS: There were no specific symptoms leading to the original mammogram, which was routinely scheduled. She has improved her diet by cutting out sodas. The patient denies unusual headaches, visual changes, nausea, vomiting, stiff neck, dizziness, or gait imbalance. There has been no cough, phlegm production, or pleurisy, no chest pain or pressure, and  no change in bowel or bladder habits. The patient denies fever, rash, bleeding, unexplained fatigue or unexplained  weight loss. A detailed review of systems was otherwise entirely negative.   PAST MEDICAL HISTORY: Past Medical History:  Diagnosis Date  . Cataracts, bilateral   . Hyperlipidemia   . Hypertension     PAST SURGICAL HISTORY: Past Surgical History:  Procedure Laterality Date  . CESAREAN SECTION    . FRACTURE SURGERY Right 2008   right leg fracture s/p pinning    FAMILY HISTORY No family history on file.  The patient's father died at age 33 due to several heart attacks and stokes. The patient's mother passed at age 44 due to old age. The patient has 4 brothers and 2 sisters. She denies a family history of breast or ovarian cancer.   GYNECOLOGIC HISTORY:  No LMP recorded. Patient is postmenopausal. Menarche: 62 years old Age at first live birth: 62 years old The patient is GX P2 LMP: at age 20 She took low dose oral contraceptive pills briefly after her 1st birth, with no complications. She had bilateral tubal ligation after her 2nd birth due to pregnancy complications. She retained her ovaries. She did not take HRT.    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 Her husband passed away years ago due to cancer (lymphoma). She lives with her significant other of 7 years, [name], who is retired from being a Furniture conservator/restorer. [name] also helps with farm work. The patient's oldest son, [name] "Corene Cornea" Fuchs is a Company secretary  At [employer]. The patient's second son, "Christia Reading" Aarini Slee, was a Engineer, building services but is now a Librarian, academic and lives near the patient's farm. The patient has 7 grandchildren.  She has to chew hours and at 120 pound [employer], as well as multiple chickens and 2 peacocks. She does not attend church.      ADVANCED DIRECTIVES: To be addressed   HEALTH MAINTENANCE: Social History   Tobacco Use  . Smoking status: Never Smoker  Substance Use Topics  . Alcohol use: No  . Drug use: No      Colonoscopy: never  PAP: [place]/ not UTD  Bone density: never     Allergies  Allergen Reactions  . Penicillins Rash  . Sulfa Antibiotics Rash    Current Outpatient Medications  Medication Sig Dispense Refill  . acetaminophen (TYLENOL) 500 MG tablet Take 1,000 mg by mouth every 6 (six) hours as needed.    . bisoprolol-hydrochlorothiazide (ZIAC) 10-6.25 MG tablet Take 1 tablet by mouth daily.  1  . Omega-3 Fatty Acids (FISH OIL) 1000 MG CAPS Take 2 capsules by mouth daily.    . vitamin C (ASCORBIC ACID) 500 MG tablet Take 250 mg by mouth daily.     No current facility-administered medications for this visit.     OBJECTIVE: Middle-aged white woman in no acute distress  Vitals:   04/13/17 1528  BP: (!) 143/75  Pulse: (!) 56  Resp: 18  Temp: 98.4 F (36.9 C)  SpO2: 100%     Body mass index is 36.58 kg/m.   Wt Readings from Last 3 Encounters:  04/13/17 240 lb 9.6 oz (109.1 kg)  01/09/16 230 lb (104.3 kg)      ECOG FS:0 - Asymptomatic  Ocular: Sclerae unicteric, pupils round and equal Ear-nose-throat: Oropharynx clear and moist Lymphatic: No cervical or supraclavicular adenopathy Lungs no rales or rhonchi Heart regular rate  and rhythm Abd soft, nontender, positive bowel sounds MSK no focal spinal tenderness, no joint edema Neuro: non-focal, well-oriented, appropriate affect Breasts: The right breast is unremarkable.  The left breast is status post recent biopsy.  There is a moderate ecchymosis.  I do not palpate a well-defined mass.  Both axillae are benign.   LAB RESULTS:  CMP     Component Value Date/Time   NA 143 04/13/2017 1449   K 3.7 04/13/2017 1449   CL 108 04/13/2017 1449   CO2 26 04/13/2017 1449   GLUCOSE 111 04/13/2017 1449   BUN 13 04/13/2017 1449   CREATININE 0.90 04/13/2017 1449   CALCIUM 9.9 04/13/2017 1449   PROT 7.1 04/13/2017 1449   ALBUMIN 3.8 04/13/2017 1449   AST 16 04/13/2017 1449   ALT 11 04/13/2017 1449   ALKPHOS 55  04/13/2017 1449   BILITOT 2.1 (H) 04/13/2017 1449   GFRNONAA >60 04/13/2017 1449   GFRAA >60 04/13/2017 1449    No results found for: TOTALPROTELP, ALBUMINELP, A1GS, A2GS, BETS, BETA2SER, GAMS, MSPIKE, SPEI  No results found for: KPAFRELGTCHN, LAMBDASER, KAPLAMBRATIO  Lab Results  Component Value Date   WBC 8.7 04/13/2017   NEUTROABS 5.1 04/13/2017   HGB 13.8 01/09/2016   HCT 38.8 04/13/2017   MCV 98.5 04/13/2017   PLT 231 04/13/2017    _0 @  No results found for: LABCA2  No components found for: TIRWER154  No results for input(s): INR in the last 168 hours.  No results found for: LABCA2  No results found for: MGQ676  No results found for: PPJ093  No results found for: OIZ124  No results found for: CA2729  No components found for: HGQUANT  No results found for: CEA1 / No results found for: CEA1   No results found for: AFPTUMOR  No results found for: CHROMOGRNA  No results found for: PSA1  Appointment on 04/13/2017  Component Date Value Ref Range Status  . Sodium 04/13/2017 143  136 - 145 mmol/L Final  . Potassium 04/13/2017 3.7  3.5 - 5.1 mmol/L Final  . Chloride 04/13/2017 108  98 - 109 mmol/L Final  . CO2 04/13/2017 26  22 - 29 mmol/L Final  . Glucose, Bld 04/13/2017 111  70 - 140 mg/dL Final  . BUN 04/13/2017 13  7 - 26 mg/dL Final  . Creatinine 04/13/2017 0.90  0.60 - 1.10 mg/dL Final  . Calcium 04/13/2017 9.9  8.4 - 10.4 mg/dL Final  . Total Protein 04/13/2017 7.1  6.4 - 8.3 g/dL Final  . Albumin 04/13/2017 3.8  3.5 - 5.0 g/dL Final  . AST 04/13/2017 16  5 - 34 U/L Final  . ALT 04/13/2017 11  0 - 55 U/L Final  . Alkaline Phosphatase 04/13/2017 55  40 - 150 U/L Final  . Total Bilirubin 04/13/2017 2.1* 0.2 - 1.2 mg/dL Final  . GFR, Est Non Af Am 04/13/2017 >60  >60 mL/min Final  . GFR, Est AFR Am 04/13/2017 >60  >60 mL/min Final   Comment: (NOTE) The eGFR has been calculated using the CKD EPI equation. This calculation has not been  validated in all clinical situations. eGFR's persistently <60 mL/min signify possible Chronic Kidney Disease.   Georgiann Hahn gap 04/13/2017 9  3 - 11 Final   Performed at [facility], [place] [address], [place], [place] [address]  . WBC Count 04/13/2017 8.7  3.9 - 10.3 K/uL Final  . RBC 04/13/2017 3.94  3.70 - 5.45 MIL/uL Final  .  Hemoglobin 04/13/2017 12.7  11.6 - 15.9 g/dL Final  . HCT 04/13/2017 38.8  34.8 - 46.6 % Final  . MCV 04/13/2017 98.5  79.5 - 101.0 fL Final  . MCH 04/13/2017 32.2  25.1 - 34.0 pg Final  . MCHC 04/13/2017 32.7  31.5 - 36.0 g/dL Final  . RDW 04/13/2017 13.2  11.2 - 14.5 % Final  . Platelet Count 04/13/2017 231  145 - 400 K/uL Final  . Neutrophils Relative % 04/13/2017 59  % Final  . Neutro Abs 04/13/2017 5.1  1.5 - 6.5 K/uL Final  . Lymphocytes Relative 04/13/2017 34  % Final  . Lymphs Abs 04/13/2017 3.0  0.9 - 3.3 K/uL Final  . Monocytes Relative 04/13/2017 6  % Final  . Monocytes Absolute 04/13/2017 0.5  0.1 - 0.9 K/uL Final  . Eosinophils Relative 04/13/2017 1  % Final  . Eosinophils Absolute 04/13/2017 0.1  0.0 - 0.5 K/uL Final  . Basophils Relative 04/13/2017 0  % Final  . Basophils Absolute 04/13/2017 0.0  0.0 - 0.1 K/uL Final   Performed at [facility], [address] [place], [place] [address]    (this displays the last labs from the last 3 days)  No results found for: TOTALPROTELP, ALBUMINELP, A1GS, A2GS, BETS, BETA2SER, GAMS, MSPIKE, SPEI (this displays SPEP labs)  No results found for: KPAFRELGTCHN, LAMBDASER, KAPLAMBRATIO (kappa/lambda light chains)  No results found for: HGBA, HGBA2QUANT, HGBFQUANT, HGBSQUAN (Hemoglobinopathy evaluation)   No results found for: LDH  No results found for: IRON, TIBC, IRONPCTSAT (Iron and TIBC)  No results found for: FERRITIN  Urinalysis    Component Value Date/Time   COLORURINE YELLOW 01/09/2016 1011   APPEARANCEUR CLEAR 01/09/2016 1011   LABSPEC 1.008  01/09/2016 1011   PHURINE 5.0 01/09/2016 1011   GLUCOSEU NEGATIVE 01/09/2016 1011   HGBUR NEGATIVE 01/09/2016 1011   BILIRUBINUR NEGATIVE 01/09/2016 [place] 01/09/2016 1011   PROTEINUR NEGATIVE 01/09/2016 1011   NITRITE NEGATIVE 01/09/2016 1011   LEUKOCYTESUR NEGATIVE 01/09/2016 1011     STUDIES: Mr Breast Bilateral W Wo Contrast Inc Cad  Addendum Date: 04/10/2017   ADDENDUM REPORT: 04/10/2017 15:44 ADDENDUM: Upon further review and comparison of the diagnostic mammograms and the post clip mammograms following the ultrasound-guided biopsy of the left breast, I believe that the biopsied mass is actually the more posterior of the 2 enhancing left breast masses, the 1 near the inframammary fold. On the post clip mL mammogram, the more anterior mass does not contain a clip. The more anterior mass, which is the more intensely enhancing of the 2, does not appear to have been sampled. It would still be reasonable to biopsy the more anterior non mass enhancement. If this is carcinoma, then seed placement adjacent to each biopsy clip would allow the 2 masses and entire span of abnormal enhancement to be included in the lumpectomy specimen. If this is not carcinoma, then the more anterior mass could either be biopsied under MRI guidance or removed at the time of lumpectomy by placing the seed between the 2 masses. Electronically Signed   By: Lajean Manes M.D.   On: 04/10/2017 15:44   Result Date: 04/10/2017 CLINICAL DATA:  New diagnosis of left breast carcinoma. Patient had 1 small posterior, lower inner quadrant mass biopsied under ultrasound guidance, which revealed invasive ductal carcinoma with papillary features, grade 2. Patient had a second adjacent mass on the diagnostic mammography. Current breast MRI is to evaluate for extent of  disease. LABS:  Creatinine was obtained on site at [place] at [address] Results: Creatinine 0.8 mg/dL.  BUN 12.  GFR 79. EXAM:  BILATERAL BREAST MRI WITH AND WITHOUT CONTRAST TECHNIQUE: Multiplanar, multisequence MR images of both breasts were obtained prior to and following the intravenous administration of 20 ml of MultiHance. THREE-DIMENSIONAL MR IMAGE RENDERING ON INDEPENDENT WORKSTATION: Three-dimensional MR images were rendered by post-processing of the original MR data on an independent workstation. The three-dimensional MR images were interpreted, and findings are reported in the following complete MRI report for this study. Three dimensional images were evaluated at the independent DynaCad workstation COMPARISON:  Previous exam(s). FINDINGS: Breast composition: b. Scattered fibroglandular tissue. Background parenchymal enhancement: Minimal Right breast: No mass or abnormal enhancement. Left breast: There are 2 enhancing masses in the posterior, lower inner quadrant of the left breast. The brightest, which shows rapid wash-in and washout kinetics, measures 7 mm. This appears to be the mass biopsied under ultrasound guidance, surrounded by a rim of dark signal consistent with a small amount of post biopsy hemorrhage. Artifact from what appears to be the clip lies just posterior and inferior to this. There is a second similar sized, but less intensely enhancing mass, more posteriorly, near the inframammary fold, with moderate wash-in and washout kinetics. There is also linearly arranged non masslike enhancement that extends anterior to the biopsied mass posteriorly and inferiorly to the second mass, covering 5.4 cm from anterior to posterior. Much of this is associated with low T1 signal suggesting areas of post biopsy hemorrhage. Lymph nodes: No abnormal appearing lymph nodes. Ancillary findings:  None. IMPRESSION: 1. 7 mm enhancing mass in the posterior lower inner quadrant of the left breast reflecting the biopsy proven breast carcinoma. Just posterior and slightly inferior to this, there is a second less intensely enhancing mass,  also 7 mm, with approximately 2.2 cm separating the 2 masses. Between these 2 masses and extending anteriorly to the biopsy proven breast carcinoma, there is linearly arranged non masslike enhancement spanning 5.4 cm from anterior to posterior. This may all represent breast carcinoma. Some of this may be enhancement along the biopsy tract. 2. There are no other areas of abnormal enhancement in the left breast. 3. No evidence of malignancy in the right breast. 4. No abnormal lymph nodes. RECOMMENDATION: 1. Given that the 2 enhancing left breast masses are in close proximity, and that both well seen mammographically, lumpectomy following a radioactive seed placed along the posterior margin of the biopsy proven carcinoma should allow both of these lesions to be removed. 2. Consider MRI guided biopsy of the non masslike enhancement extending anteriorly to the biopsy-proven carcinoma if this would alter management. BI-RADS CATEGORY  6: Known biopsy-proven malignancy. Electronically Signed: By: Lajean Manes M.D. On: 04/10/2017 15:17   Mm Clip Placement Left  Result Date: 03/28/2017 CLINICAL DATA:  Evaluate biopsy clip EXAM: DIAGNOSTIC LEFT MAMMOGRAM POST ULTRASOUND BIOPSY COMPARISON:  Previous exam(s). FINDINGS: Mammographic images were obtained following ultrasound guided biopsy of a left breast mass. The ribbon shaped biopsy clip is within 1 of breast masses. IMPRESSION: Appropriate clip placement. Final Assessment: Post Procedure Mammograms for Marker Placement Electronically Signed   By: Dorise Bullion III M.D   On: 03/28/2017 13:58   [place] Lt Breast Bx W Loc Dev 1st Lesion Img Bx Spec US Guide  Addendum Date: 04/10/2017   ADDENDUM REPORT: 04/10/2017 11:46 ADDENDUM: Pathology of the left breast biopsy revealed INVASIVE DUCTAL CARCINOMA WITH PAPILLARY FEATURES. Microscopic  Comment: The carcinoma appears grade 2. This was found to be concordant by Dr. Jimmye Norman. Recommendation: Bilateral breast MRI without and with  contrast to determine extent of disease. Surgical and oncology referrals. At the patient's request, results and recommendations were relayed to the patient by Dr. Jimmye Norman by phone on 03/30/17. The patient stated she did well following the biopsy. All of her questions were answered. The patient was contacted by Jetta Lout, [facility]) regarding referral. She requested the referral be made to a surgeon in [place]. A referral was made with Dr. Dalbert Batman, [facility] for 04/04/17 at 4:30 PM. The patient was notified of the appointment. Results and recommendations were relayed to Delman Cheadle, PA by Jetta Lout, RRA on 03/30/17. She will defer order for breast MRI to the surgeon since the surgical referral appointment is in 4 days. She will fax office notes to [facility]. A copy of the pathology report was faxed to Delman Cheadle, PA by Jetta Lout, RRA. Addendum by Jetta Lout, RRA on 03/31/17. Electronically Signed   By: Dorise Bullion III M.D   On: 04/10/2017 11:46   Result Date: 04/10/2017 CLINICAL DATA:  Left breast mass biopsy EXAM: ULTRASOUND GUIDED  LEFT  BREAST CORE NEEDLE BIOPSY COMPARISON:  Previous exam(s). FINDINGS: I met with the patient and we discussed the procedure of ultrasound-guided biopsy, including benefits and alternatives. We discussed the high likelihood of a successful procedure. We discussed the risks of the procedure, including infection, bleeding, tissue injury, clip migration, and inadequate sampling. Informed written consent was given. The usual time-out protocol was performed immediately prior to the procedure. Lesion quadrant:  Lower-inner Using sterile technique and 1% Lidocaine as local anesthetic, under direct ultrasound visualization, a 12 gauge spring-loaded device was used to perform biopsy of a left breast mass using a medial approach. At the conclusion of the procedure a tissue marker clip was deployed into the biopsy cavity.  Follow up 2 view mammogram was performed and dictated separately. IMPRESSION: Ultrasound guided biopsy of a left breast mass . No apparent complications. Of note, biopsy of 2 masses was recommended. However, after imaging the patient, I was not convinced that I could appropriately choose the 2 masses that would prove extent of disease. Given the likelihood of malignancy and the importance to prove extent of disease, I biopsied 1 site with the intention of performing a breast MRI and subsequent breast MRI guided biopsy to adequately prove the extent of disease. Electronically Signed: By: Dorise Bullion III M.D On: 04/10/2017 11:40    ELIGIBLE FOR AVAILABLE RESEARCH PROTOCOL: no  ASSESSMENT: 62 y.o.  [place] woman status post left breast lower inner quadrant biopsy 03/28/2017 for a clinical mT1b N0, stage I invasive ductal carcinoma, grade 2, estrogen and progesterone receptor strongly positive, with no HER-2 amplification, and an MIB-1 of 2%.  (1) definitive surgery pending  (2) Oncotype to be obtained from the definitive surgical sample: Chemotherapy not anticipated  (3) adjuvant radiation to follow as appropriate  (4) antiestrogens to follow at the completion of local treatment.    PLAN: We spent the better part of today's hour-long appointment discussing the biology of her diagnosis and the specifics of her situation. We first reviewed the fact that cancer is not one disease but more than 100 different diseases and that it is important to keep them separate-- otherwise when friends and relatives discuss their own cancer experiences with [place] confusion can result. Similarly we explained that if breast cancer spreads  to the bone or liver, the patient would not have bone cancer or liver cancer, but breast cancer in the bone and breast cancer in the liver: one cancer in three places-- not 3 different cancers which otherwise would have to be treated in 3 different ways.  We discussed the  difference between local and systemic therapy. In terms of loco-regional treatment, lumpectomy plus radiation is equivalent to mastectomy as far as survival is concerned. For this reason, and because the cosmetic results are generally superior, we generally recommend breast conserving surgery.  Of course if good cosmesis cannot be assured with lumpectomy or perhaps with lumpectomy with oncoplastic, then mastectomy may be preferable.  She is discussing this with Dr. Dalbert Batman at this point.    We then discussed the rationale for systemic therapy. There is some risk that this cancer may have already spread to other parts of her body. Patients frequently ask at this point about bone scans, CAT scans and PET scans to find out if they have occult breast cancer somewhere else. The problem is that in early stage disease we are much more likely to find false positives then true cancers and this would expose the patient to unnecessary procedures as well as unnecessary radiation. Scans cannot answer the question the patient really would like to know, which is whether she has microscopic disease elsewhere in her body. For those reasons we do not recommend them.  Of course we would proceed to aggressive evaluation of any symptoms that might suggest metastatic disease, but that is not the case here.  Next we went over the options for systemic therapy which are anti-estrogens, anti-HER-2 immunotherapy, and chemotherapy. [place] does not meet criteria for anti-HER-2 immunotherapy. She is a good candidate for anti-estrogens.  The question of chemotherapy is more complicated. Chemotherapy is most effective in rapidly growing, aggressive tumors. It is much less effective in slow growing cancers, like [place] 's. For that reason we are going to request an Oncotype from the definitive surgical sample, as suggested by NCCN guidelines. That will help [place] make a definitive decision regarding chemotherapy in this case.  However she  understands that I do not expect that she will benefit from chemotherapy and that her prognosis should be excellent without chemotherapy  The overall plan then is to complete the planning for surgery, and then decide what surgery she will undergo.  I believe this can be accomplished in the next 2-3 weeks.  Oncotype testing takes an additional 2 weeks so I am requesting an appointment for her here with me in about 6 weeks.  She had an appointment with Dr. Lisbeth Renshaw scheduled for next week, but she is considering having radiation through the [facility] group assuming they are in her insurance.  Of course we do not know if she will need radiation because she might end up with a mastectomy.  For those reasons she would prefer to cancel the appointment with Dr. Lisbeth Renshaw and reschedule it for the same day that she returns to see me in about 6 weeks.  I will be glad to facilitate that for her  Levon has a good understanding of the overall plan. She agrees with it. She knows the goal of treatment in her case is cure. She will call with any problems that may develop before her next visit here.   Magrinat, Virgie Dad, MD  04/13/17 4:57 PM Medical Oncology and Hematology [facility] [address] [place], [place] [address] Tel. [phone]  Fax. [phone]  This document serves as a record of services personally performed by Lurline Del, MD. It was created on his behalf by Sheron Nightingale, a trained medical scribe. The creation of this record is based on the scribe's personal observations and the provider's statements to them.   I have reviewed the above documentation for accuracy and completeness, and I agree with the above.

## 2017-04-12 ENCOUNTER — Encounter: Payer: Self-pay | Admitting: Adult Health

## 2017-04-12 ENCOUNTER — Other Ambulatory Visit: Payer: Self-pay | Admitting: *Deleted

## 2017-04-12 DIAGNOSIS — C50312 Malignant neoplasm of lower-inner quadrant of left female breast: Secondary | ICD-10-CM

## 2017-04-12 DIAGNOSIS — Z170 Estrogen receptor positive status [ER+]: Secondary | ICD-10-CM

## 2017-04-13 ENCOUNTER — Inpatient Hospital Stay: Payer: BLUE CROSS/BLUE SHIELD | Attending: Oncology | Admitting: Oncology

## 2017-04-13 ENCOUNTER — Inpatient Hospital Stay: Payer: BLUE CROSS/BLUE SHIELD

## 2017-04-13 ENCOUNTER — Other Ambulatory Visit: Payer: Self-pay | Admitting: General Surgery

## 2017-04-13 ENCOUNTER — Encounter: Payer: Self-pay | Admitting: Oncology

## 2017-04-13 VITALS — BP 143/75 | HR 56 | Temp 98.4°F | Resp 18 | Ht 68.0 in | Wt 240.6 lb

## 2017-04-13 DIAGNOSIS — Z170 Estrogen receptor positive status [ER+]: Secondary | ICD-10-CM | POA: Diagnosis not present

## 2017-04-13 DIAGNOSIS — C50312 Malignant neoplasm of lower-inner quadrant of left female breast: Secondary | ICD-10-CM | POA: Diagnosis not present

## 2017-04-13 DIAGNOSIS — R928 Other abnormal and inconclusive findings on diagnostic imaging of breast: Secondary | ICD-10-CM

## 2017-04-13 HISTORY — PX: BREAST BIOPSY: SHX20

## 2017-04-13 LAB — CMP (CANCER CENTER ONLY)
ALBUMIN: 3.8 g/dL (ref 3.5–5.0)
ALT: 11 U/L (ref 0–55)
AST: 16 U/L (ref 5–34)
Alkaline Phosphatase: 55 U/L (ref 40–150)
Anion gap: 9 (ref 3–11)
BUN: 13 mg/dL (ref 7–26)
CHLORIDE: 108 mmol/L (ref 98–109)
CO2: 26 mmol/L (ref 22–29)
Calcium: 9.9 mg/dL (ref 8.4–10.4)
Creatinine: 0.9 mg/dL (ref 0.60–1.10)
GFR, Est AFR Am: >60 mL/min (ref >60)
GFR, Estimated: >60 mL/min (ref >60)
GLUCOSE: 111 mg/dL (ref 70–140)
POTASSIUM: 3.7 mmol/L (ref 3.5–5.1)
SODIUM: 143 mmol/L (ref 136–145)
Total Bilirubin: 2.1 mg/dL — ABNORMAL HIGH (ref 0.2–1.2)
Total Protein: 7.1 g/dL (ref 6.4–8.3)

## 2017-04-13 LAB — CBC WITH DIFFERENTIAL (CANCER CENTER ONLY)
Basophils Absolute: 0 10*3/uL (ref 0.0–0.1)
Basophils Relative: 0 %
Eosinophils Absolute: 0.1 10*3/uL (ref 0.0–0.5)
Eosinophils Relative: 1 %
HCT: 38.8 % (ref 34.8–46.6)
Hemoglobin: 12.7 g/dL (ref 11.6–15.9)
LYMPHS ABS: 3 10*3/uL (ref 0.9–3.3)
LYMPHS PCT: 34 %
MCH: 32.2 pg (ref 25.1–34.0)
MCHC: 32.7 g/dL (ref 31.5–36.0)
MCV: 98.5 fL (ref 79.5–101.0)
MONO ABS: 0.5 10*3/uL (ref 0.1–0.9)
Monocytes Relative: 6 %
Neutro Abs: 5.1 10*3/uL (ref 1.5–6.5)
Neutrophils Relative %: 59 %
PLATELETS: 231 10*3/uL (ref 145–400)
RBC: 3.94 MIL/uL (ref 3.70–5.45)
RDW: 13.2 % (ref 11.2–14.5)
WBC: 8.7 10*3/uL (ref 3.9–10.3)

## 2017-04-14 ENCOUNTER — Telehealth: Payer: Self-pay | Admitting: Oncology

## 2017-04-14 NOTE — Telephone Encounter (Signed)
Per 3/21 no los °

## 2017-04-17 ENCOUNTER — Other Ambulatory Visit: Payer: Self-pay | Admitting: General Surgery

## 2017-04-17 ENCOUNTER — Ambulatory Visit
Admission: RE | Admit: 2017-04-17 | Discharge: 2017-04-17 | Disposition: A | Discharge status: Home / Self Care | Payer: BLUE CROSS/BLUE SHIELD | Source: Ambulatory Visit | Attending: General Surgery | Admitting: General Surgery

## 2017-04-17 DIAGNOSIS — R9389 Abnormal findings on diagnostic imaging of other specified body structures: Secondary | ICD-10-CM

## 2017-04-17 DIAGNOSIS — R928 Other abnormal and inconclusive findings on diagnostic imaging of breast: Secondary | ICD-10-CM

## 2017-04-17 DIAGNOSIS — N6489 Other specified disorders of breast: Secondary | ICD-10-CM | POA: Diagnosis not present

## 2017-04-17 DIAGNOSIS — C50812 Malignant neoplasm of overlapping sites of left female breast: Secondary | ICD-10-CM | POA: Diagnosis not present

## 2017-04-17 MED ORDER — GADOBENATE DIMEGLUMINE 529 MG/ML IV SOLN
19.0000 mL | Freq: Once | INTRAVENOUS | Status: AC | PRN
Start: 2017-04-17 — End: 2017-04-17
  Administered 2017-04-17: 19 mL via INTRAVENOUS

## 2017-04-20 ENCOUNTER — Ambulatory Visit: Payer: BLUE CROSS/BLUE SHIELD | Admitting: Radiation Oncology

## 2017-04-20 ENCOUNTER — Ambulatory Visit: Payer: Self-pay

## 2017-04-25 ENCOUNTER — Other Ambulatory Visit: Payer: Self-pay | Admitting: General Surgery

## 2017-04-25 DIAGNOSIS — C50312 Malignant neoplasm of lower-inner quadrant of left female breast: Secondary | ICD-10-CM

## 2017-04-25 DIAGNOSIS — Z98891 History of uterine scar from previous surgery: Secondary | ICD-10-CM | POA: Diagnosis not present

## 2017-04-25 DIAGNOSIS — I10 Essential (primary) hypertension: Secondary | ICD-10-CM | POA: Diagnosis not present

## 2017-04-25 DIAGNOSIS — Z170 Estrogen receptor positive status [ER+]: Principal | ICD-10-CM

## 2017-04-25 DIAGNOSIS — Z6836 Body mass index (BMI) 36.0-36.9, adult: Secondary | ICD-10-CM | POA: Diagnosis not present

## 2017-04-26 ENCOUNTER — Other Ambulatory Visit: Payer: Self-pay | Admitting: General Surgery

## 2017-04-26 DIAGNOSIS — Z170 Estrogen receptor positive status [ER+]: Principal | ICD-10-CM

## 2017-04-26 DIAGNOSIS — C50312 Malignant neoplasm of lower-inner quadrant of left female breast: Secondary | ICD-10-CM

## 2017-05-02 NOTE — Pre-Procedure Instructions (Signed)
ADAMARI FREDE  05/02/2017      [facility], [place] - [address] [place] [place] [address] Phone: [phone] Fax: [phone]    Your procedure is scheduled on Wednesday, April 17.  Report to [facility] Admitting at 6:30 A.M.   Call this number if you have problems the morning of surgery: [phone]      For any other questions, please call [phone], Monday - Friday 8 AM - 4 PM. PAT desk , ask to speak to any nurse.    Remember:  Do not eat food or drink liquids after midnight Tuesday, April 16.  Take these medicines the morning of surgery with A SIP OF WATER: bisoprolol-hydrochlorothiazide [facility])   May take: acetaminophen (TYLENOL) if needed.  1 Week prior to surgery STOP taking Aspirin, Aspirin Products (Goody Powder, Excedrin Migraine), Ibuprofen (Advil), Naproxen (Aleve), Vitamins and Herbal Products (ie Fish Oil)  Special instructions:  [facility]- Preparing For Surgery  Before surgery, you can play an important role. Because skin is not sterile, your skin needs to be as free of germs as possible. You can reduce the number of germs on your skin by washing with CHG (chlorahexidine gluconate) Soap before surgery.  CHG is an antiseptic cleaner which kills germs and bonds with the skin to continue killing germs even after washing.  Please do not use if you have an allergy to CHG or antibacterial soaps. If your skin becomes reddened/irritated stop using the CHG.  Do not shave (including legs and underarms) for at least 48 hours prior to first CHG shower. It is OK to shave your face.  Please follow these instructions carefully.   1. Shower the NIGHT BEFORE SURGERY and the MORNING OF SURGERY with CHG.   2. If you chose to wash your hair, wash your hair first as usual with your normal shampoo.  3. After you shampoo, rinse your hair and body thoroughly to remove the shampoo.           Wash Face and genitals  (private parts)  with your normal soap, then rinse.   4. Use CHG as you would any other liquid soap. You can apply CHG directly to the skin and wash gently with a scrungie or a clean washcloth.   Apply the CHG Soap to your body ONLY FROM THE NECK DOWN.  Do not use on open wounds or open sores. Avoid contact with your eyes, ears, mouth and genitals (private parts).  5. Wash thoroughly, paying special attention to the area where your surgery will be performed.  6. Thoroughly rinse your body with warm water from the neck down.  7. DO NOT shower/wash with your normal soap after using and rinsing off the CHG Soap.  8. Pat yourself dry with a CLEAN TOWEL.  9. Wear CLEAN PAJAMAS to bed the night before surgery, wear comfortable clothes the morning of surgery  10. Place CLEAN SHEETS on your bed the night of your first shower and DO NOT SLEEP WITH PETS.  Day of Surgery: Shower the morning of surgery Do not apply any deodorants/lotions, powders or colognes. Please wear clean clothes to the hospital/surgery center.    Do not wear jewelry, make-up or nail polish.  Do not shave 48 hours prior to surgery.  Men may shave face and neck.  Do not bring valuables to the hospital.  [facility] is not responsible for any belongings or valuables.  Contacts, dentures  or bridgework may not be worn into surgery.  Leave your suitcase in the car.  After surgery it may be brought to your room.  For patients admitted to the hospital, discharge time will be determined by your treatment team.  Patients discharged the day of surgery will not be allowed to drive home.   Please read over the following fact sheets that you were given.

## 2017-05-03 ENCOUNTER — Other Ambulatory Visit: Payer: Self-pay

## 2017-05-03 ENCOUNTER — Encounter (HOSPITAL_COMMUNITY)
Admission: RE | Admit: 2017-05-03 | Discharge: 2017-05-03 | Disposition: A | Discharge status: Home / Self Care | Payer: BLUE CROSS/BLUE SHIELD | Source: Ambulatory Visit | Attending: General Surgery | Admitting: General Surgery

## 2017-05-03 ENCOUNTER — Encounter (HOSPITAL_COMMUNITY): Payer: Self-pay

## 2017-05-03 DIAGNOSIS — Z01810 Encounter for preprocedural cardiovascular examination: Secondary | ICD-10-CM | POA: Insufficient documentation

## 2017-05-03 DIAGNOSIS — Z01812 Encounter for preprocedural laboratory examination: Secondary | ICD-10-CM | POA: Diagnosis not present

## 2017-05-03 DIAGNOSIS — C50312 Malignant neoplasm of lower-inner quadrant of left female breast: Secondary | ICD-10-CM | POA: Insufficient documentation

## 2017-05-03 HISTORY — DX: Other specified postprocedural states: Z98.890

## 2017-05-03 HISTORY — DX: Other complications of anesthesia, initial encounter: T88.59XA

## 2017-05-03 HISTORY — DX: Other specified postprocedural states: R11.2

## 2017-05-03 HISTORY — DX: Adverse effect of unspecified anesthetic, initial encounter: T41.45XA

## 2017-05-03 LAB — CBC WITH DIFFERENTIAL/PLATELET
BASOS ABS: 0.1 10*3/uL (ref 0.0–0.1)
Basophils Relative: 1 %
EOS ABS: 0.1 10*3/uL (ref 0.0–0.7)
Eosinophils Relative: 2 %
HCT: 38.6 % (ref 36.0–46.0)
HEMOGLOBIN: 12.6 g/dL (ref 12.0–15.0)
LYMPHS ABS: 3.4 10*3/uL (ref 0.7–4.0)
Lymphocytes Relative: 40 %
MCH: 31.7 pg (ref 26.0–34.0)
MCHC: 32.6 g/dL (ref 30.0–36.0)
MCV: 97.2 fL (ref 78.0–100.0)
Monocytes Absolute: 0.6 10*3/uL (ref 0.1–1.0)
Monocytes Relative: 7 %
NEUTROS PCT: 50 %
Neutro Abs: 4.3 10*3/uL (ref 1.7–7.7)
Platelets: 215 10*3/uL (ref 150–400)
RBC: 3.97 MIL/uL (ref 3.87–5.11)
RDW: 13.1 % (ref 11.5–15.5)
WBC: 8.5 10*3/uL (ref 4.0–10.5)

## 2017-05-03 LAB — COMPREHENSIVE METABOLIC PANEL
ALBUMIN: 3.9 g/dL (ref 3.5–5.0)
ALK PHOS: 49 U/L (ref 38–126)
ALT: 17 U/L (ref 14–54)
AST: 19 U/L (ref 15–41)
Anion gap: 10 (ref 5–15)
BUN: 14 mg/dL (ref 6–20)
CALCIUM: 9.2 mg/dL (ref 8.9–10.3)
CO2: 24 mmol/L (ref 22–32)
CREATININE: 0.91 mg/dL (ref 0.44–1.00)
Chloride: 107 mmol/L (ref 101–111)
GFR calc Af Amer: >60 mL/min (ref >60)
GFR calc non Af Amer: >60 mL/min (ref >60)
GLUCOSE: 103 mg/dL — AB (ref 65–99)
Potassium: 3.8 mmol/L (ref 3.5–5.1)
SODIUM: 141 mmol/L (ref 135–145)
Total Bilirubin: 1.7 mg/dL — ABNORMAL HIGH (ref 0.3–1.2)
Total Protein: 6.8 g/dL (ref 6.5–8.1)

## 2017-05-03 MED ORDER — CHLORHEXIDINE GLUCONATE CLOTH 2 % EX PADS: 6.0000 | MEDICATED_PAD | Freq: Once | CUTANEOUS | Status: DC

## 2017-05-03 NOTE — Progress Notes (Signed)
CARLINA DERKS            05/02/2017                          [facility], [place] - [address] [place] [address] Phone: [phone] Fax: [phone]              Your procedure is scheduled on Wednesday, April 17.            Report to [facility] Admitting at 6:30 A.M.                    Remember:            Do not eat food or drink liquids after midnight Tuesday, April 16  Other than Ensure Pre-surgery drink)             Take these medicines the morning of surgery with A SIP OF WATER: bisoprolol-hydrochlorothiazide [facility])  May take: acetaminophen (TYLENOL) if needed.  Please complete your PRE-SURGERY ENSURE that was given to before you leave your house the morning of surgery.  Please, if able, drink it in one setting. DO NOT SIP.  1 Week prior to surgery STOP taking Aspirin, Aspirin Products (Goody Powder, Excedrin Migraine), Ibuprofen (Advil), Naproxen (Aleve), Vitamins and Herbal Products (ie Fish Oil)  Special instructions:  [facility]- Preparing For Surgery  Before surgery, you can play an important role. Because skin is not sterile, your skin needs to be as free of germs as possible. You can reduce the number of germs on your skin by washing with CHG (chlorahexidine gluconate) Soap before surgery.  CHG is an antiseptic cleaner which kills germs and bonds with the skin to continue killing germs even after washing.  Please do not use if you have an allergy to CHG or antibacterial soaps. If your skin becomes reddened/irritated stop using the CHG.  Do not shave (including legs and underarms) for at least 48 hours prior to first CHG shower. It is OK to shave your face.  Please follow these instructions carefully.                                                                                                                     1. Shower the NIGHT BEFORE SURGERY and the MORNING OF SURGERY with CHG.    2. If you chose to wash your hair, wash your hair first as usual with your normal shampoo.  3. After you shampoo, rinse your hair and body thoroughly to remove the shampoo.           Wash Face and genitals (private parts)  with your normal soap, then rinse.   4. Use CHG as you would any other liquid soap. You can apply CHG directly to the skin and wash gently with a scrungie or a clean washcloth.   Apply the CHG Soap to your body ONLY  FROM THE NECK DOWN.  Do not use on open wounds or open sores. Avoid contact with your eyes, ears, mouth and genitals (private parts).  5. Wash thoroughly, paying special attention to the area where your surgery will be performed.  6. Thoroughly rinse your body with warm water from the neck down.  7. DO NOT shower/wash with your normal soap after using and rinsing off the CHG Soap.  8. Pat yourself dry with a CLEAN TOWEL.  9. Wear CLEAN PAJAMAS to bed the night before surgery, wear comfortable clothes the morning of surgery  10. Place CLEAN SHEETS on your bed the night of your first shower and DO NOT SLEEP WITH PETS.  Day of Surgery: Shower the morning of surgery Do not apply any deodorants/lotions, powders or colognes. Please wear clean clothes to the hospital/surgery center.              Do not wear jewelry, make-up or nail polish.            Do not shave 48 hours prior to surgery.  Men may shave face and neck.            Do not bring valuables to the hospital.            [facility] is not responsible for any belongings or valuables.  Contacts, dentures or bridgework may not be worn into surgery.  Leave your suitcase in the car.  After surgery it may be brought to your room.  For patients admitted to the hospital, discharge time will be determined by your treatment team.  Patients discharged the day of surgery will not be allowed to drive home.   Please read over the following fact sheets that you were given.

## 2017-05-03 NOTE — Progress Notes (Addendum)
PCP: Delman Cheadle, PA-C  Cardiologist: pt denies  EKG: pt denies past year, no cardiac hx  Stress test: pt denies ever  ECHO: pt denies ever  Cardiac Cath: pt denies ever  Chest x-ray: pt denies past year, no respiratory complications/infections

## 2017-05-07 NOTE — H&P (Signed)
Alexander Bergeron Location: [facility] Surgery Patient #: [id number] DOB: 1955-12-15 Single / Language: Cleophus Molt / Race: White Female       History of Present Illness       The patient is a 62 year old female who presents with breast cancer. This is a 62 year old female with breast cancer who returns for a preop conference. Initially referred by the [facility]. Her listed PCP as Delman Cheadle and Dr. Wolfgang Phoenix in [place]. Her oncologist is Dr. Jana Hakim.      No prior breast problems. Gets annual mammograms. Imaging studies this year show multiple nodules in the left breast at the 7:30 to 8 o'clock position near the inframammary fold, 12 cm from the nipple. There were 3 or 4 masses in this location. Axillary ultrasound negative. Biopsy of one of these nodules shows invasive ductal carcinoma, grade 2. ER and PR strongly positive. HER-2 negative       We had a long talk when I first saw her on April 04, 2017. I was concerned that she had multifocal cancer but possibly localized to the lower inner left breast. MRI showed some more enhancement anterior to these nodules but very close by. There was no other masses in either breast. There is no axillary adenopathy. Radiology performed an MRI guided biopsy more anterior to this area. This also showed invasive cancer, receptor positive, HER-2 negative. The distance between the 2 postbiopsy markers is 2.6 cm.      It appears that she may have multifocal cancer but very localized to the lower inner quadrant of the left breast at the inframammary fold and just above this.     Past history significant for C-section through lower midline. Hypertension hyperlipidemia. Right knee surgery. Family history negative for breast or ovarian cancer. Mother and father had heart disease and strokes. She lives in [place]. Her fianc was with her last time but not today. Today's visit was chaperoned by Dr. Brett Albino of the family practice service       We had a very long talk. We talked about lumpectomy, sentinel node biopsy, mastectomy with or without reconstruction She is strongly motivated for breast conservation      We are going to try to do a generous lumpectomy of the left breast, lower inner quadrant placing 2 radioactive seeds and performing a elliptical skin incision and try to get a negative margin. This will be a curvilinear ellipse with 2 seeds but one specimen. Deep axillary sentinel node biopsy will also be performed. I think we can have a good chance of accomplishing this without great deformity because her breasts are large. I discussed the indications, details, techniques, and numerous risk of the surgery with her. She is aware of the risk of bleeding, infection, cosmetic deformity requiring plastic surgical revision, reoperation for positive margins or positive nodes. She understands all these issues. All of her questions were answered. She agrees with this plan.      She knows that she will likely be offered radiation therapy. Oncotype will be obtained from the final surgical specimen. Dr. Jana Hakim does not anticipate chemotherapy Adjuvant radiation therapy is anticipated Anti-estrogens to follow the completion of local treatment     Allergies  Sulfa Antibiotics  Penicillins  Allergies Reconciled   Medication History Atorvastatin Calcium (10MG Tablet, Oral) Active. Bisoprolol-Hydrochlorothiazide (10-6.25MG Tablet, Oral) Active. Fish Oil (Oral) Specific strength unknown - Active. Vitamin C (Oral) Specific strength unknown - Active. Vitamin E (Oral) Specific strength unknown - Active. Medications Reconciled  Vitals  Weight: 240 lb Height: 68in Body Surface Area: 2.21 m Body Mass Index: 36.49 kg/m  Temp.: 97.59F(Oral)  Pulse: 88 (Regular)  BP: 138/80 (Sitting, Left Arm, Standard)     Physical Exam  General Mental Status-Alert. General Appearance-Not in acute  distress. Build & Nutrition-Well nourished. Posture-Normal posture. Gait-Normal. Note: BMI 36   Head and Neck Head-normocephalic, atraumatic with no lesions or palpable masses. Trachea-midline. Thyroid Gland Characteristics - normal size and consistency and no palpable nodules.  Chest and Lung Exam Chest and lung exam reveals -on auscultation, normal breath sounds, no adventitious sounds and normal vocal resonance.  Breast Note: Breasts are very large. Right breast reveals no skin change palpable mass or adenopathy. Left breast reveals rule out resolving ecchymoses lower inner quadrant. There is a small palpable rubbery mass just above the inframammary fold in the lower inner segment. This may be one of the tumors. No other masses in either breast. Nipple areolar complex is normal. No adenopathy on the left.   Cardiovascular Cardiovascular examination reveals -normal heart sounds, regular rate and rhythm with no murmurs and femoral artery auscultation bilaterally reveals normal pulses, no bruits, no thrills.  Abdomen Inspection Inspection of the abdomen reveals - No Hernias. Palpation/Percussion Palpation and Percussion of the abdomen reveal - Soft, Non Tender, No Rigidity (guarding), No hepatosplenomegaly and No Palpable abdominal masses.  Neurologic Neurologic evaluation reveals -alert and oriented x 3 with no impairment of recent or remote memory, normal attention span and ability to concentrate, normal sensation and normal coordination.  Musculoskeletal Normal Exam - Bilateral-Upper Extremity Strength Normal and Lower Extremity Strength Normal.    Assessment & Plan  PRIMARY CANCER OF LOWER-INNER QUADRANT OF LEFT FEMALE BREAST (C50.312)       You have 2 biopsy-proven cancers in the left breast There may be other cancer in the area, but all of it seems localized to the lower inner quadrant near the bottom of the breast We have discussed options,  including mastectomy with or without reconstruction, lumpectomy radioactive seed localization, and sentinel lymph node biopsy       We have decided to proceed with left breast lumpectomy with placement of 2 radioactive seeds and a left axillary sentinel lymph node biopsy This will require an elliptical excision of skin and breast tissue in the lower inner quadrant as well as the lymph node biopsies as we discussed We have discussed the indications, techniques, and risk of the surgery in detail she will need to have radiation therapy to the left breast once this heals  Decisions about chemotherapy and radiation therapy will be made after the final pathology is reviewed.  We will schedule the surgery in the near future  BMI 36.0-36.9,ADULT (Z68.36) HYPERTENSION, ESSENTIAL (I10) HISTORY OF C-SECTION (N39.767)    Edsel Petrin. Dalbert Batman, M.D., [facility] General and Minimally invasive Surgery Breast and Colorectal Surgery Office:   [phone] Pager:   [phone]

## 2017-05-08 ENCOUNTER — Ambulatory Visit
Admission: RE | Admit: 2017-05-08 | Discharge: 2017-05-08 | Disposition: A | Discharge status: Home / Self Care | Payer: BLUE CROSS/BLUE SHIELD | Source: Ambulatory Visit | Attending: General Surgery | Admitting: General Surgery

## 2017-05-08 ENCOUNTER — Other Ambulatory Visit: Payer: Self-pay | Admitting: General Surgery

## 2017-05-08 DIAGNOSIS — Z170 Estrogen receptor positive status [ER+]: Principal | ICD-10-CM

## 2017-05-08 DIAGNOSIS — C50312 Malignant neoplasm of lower-inner quadrant of left female breast: Secondary | ICD-10-CM

## 2017-05-08 DIAGNOSIS — C50912 Malignant neoplasm of unspecified site of left female breast: Secondary | ICD-10-CM | POA: Diagnosis not present

## 2017-05-09 MED ORDER — CEFAZOLIN SODIUM 10 G IJ SOLR
3.0000 g | INTRAMUSCULAR | Status: AC
  Administered 2017-05-10: 3 g via INTRAVENOUS
  Filled 2017-05-09: qty 3

## 2017-05-10 ENCOUNTER — Ambulatory Visit (HOSPITAL_COMMUNITY)
Admission: RE | Admit: 2017-05-10 | Discharge: 2017-05-10 | Disposition: A | Discharge status: Home / Self Care | Payer: BLUE CROSS/BLUE SHIELD | Source: Ambulatory Visit | Attending: General Surgery | Admitting: General Surgery

## 2017-05-10 ENCOUNTER — Ambulatory Visit
Admission: RE | Admit: 2017-05-10 | Discharge: 2017-05-10 | Disposition: A | Discharge status: Home / Self Care | Payer: BLUE CROSS/BLUE SHIELD | Source: Ambulatory Visit | Attending: General Surgery | Admitting: General Surgery

## 2017-05-10 ENCOUNTER — Ambulatory Visit (HOSPITAL_COMMUNITY): Payer: BLUE CROSS/BLUE SHIELD | Admitting: Certified Registered Nurse Anesthetist

## 2017-05-10 ENCOUNTER — Encounter (HOSPITAL_COMMUNITY): Payer: Self-pay | Admitting: *Deleted

## 2017-05-10 ENCOUNTER — Encounter (HOSPITAL_COMMUNITY)
Admission: RE | Disposition: A | Discharge status: Home / Self Care | Payer: Self-pay | Source: Ambulatory Visit | Attending: General Surgery

## 2017-05-10 DIAGNOSIS — Z79899 Other long term (current) drug therapy: Secondary | ICD-10-CM | POA: Diagnosis not present

## 2017-05-10 DIAGNOSIS — I10 Essential (primary) hypertension: Secondary | ICD-10-CM | POA: Insufficient documentation

## 2017-05-10 DIAGNOSIS — Z170 Estrogen receptor positive status [ER+]: Principal | ICD-10-CM

## 2017-05-10 DIAGNOSIS — E785 Hyperlipidemia, unspecified: Secondary | ICD-10-CM | POA: Diagnosis not present

## 2017-05-10 DIAGNOSIS — C50312 Malignant neoplasm of lower-inner quadrant of left female breast: Secondary | ICD-10-CM

## 2017-05-10 DIAGNOSIS — G8918 Other acute postprocedural pain: Secondary | ICD-10-CM | POA: Diagnosis not present

## 2017-05-10 DIAGNOSIS — C50912 Malignant neoplasm of unspecified site of left female breast: Secondary | ICD-10-CM | POA: Diagnosis not present

## 2017-05-10 HISTORY — PX: BREAST LUMPECTOMY WITH RADIOACTIVE SEED AND SENTINEL LYMPH NODE BIOPSY: SHX6550

## 2017-05-10 HISTORY — PX: BREAST LUMPECTOMY: SHX2

## 2017-05-10 SURGERY — BREAST LUMPECTOMY WITH RADIOACTIVE SEED AND SENTINEL LYMPH NODE BIOPSY
Anesthesia: General | Site: Breast | Laterality: Left

## 2017-05-10 MED ORDER — HYDROCODONE-ACETAMINOPHEN 5-325 MG PO TABS: 1.0000 | ORAL_TABLET | Freq: Four times a day (QID) | ORAL | 0 refills | Status: DC | PRN

## 2017-05-10 MED ORDER — SCOPOLAMINE 1 MG/3DAYS TD PT72
MEDICATED_PATCH | TRANSDERMAL | Status: DC | PRN
  Administered 2017-05-10: 1 via TRANSDERMAL

## 2017-05-10 MED ORDER — ONDANSETRON HCL 4 MG/2ML IJ SOLN
INTRAMUSCULAR | Status: AC
  Filled 2017-05-10: qty 2

## 2017-05-10 MED ORDER — FENTANYL CITRATE (PF) 250 MCG/5ML IJ SOLN
INTRAMUSCULAR | Status: AC
  Filled 2017-05-10: qty 5

## 2017-05-10 MED ORDER — MIDAZOLAM HCL 5 MG/5ML IJ SOLN
INTRAMUSCULAR | Status: DC | PRN
  Administered 2017-05-10 (×2): 1 mg via INTRAVENOUS

## 2017-05-10 MED ORDER — FENTANYL CITRATE (PF) 100 MCG/2ML IJ SOLN
INTRAMUSCULAR | Status: DC | PRN
  Administered 2017-05-10: 75 ug via INTRAVENOUS
  Administered 2017-05-10: 25 ug via INTRAVENOUS
  Administered 2017-05-10 (×2): 50 ug via INTRAVENOUS

## 2017-05-10 MED ORDER — LACTATED RINGERS IV SOLN
INTRAVENOUS | Status: DC | PRN
  Administered 2017-05-10: 08:00:00 via INTRAVENOUS

## 2017-05-10 MED ORDER — GLYCOPYRROLATE 0.2 MG/ML IJ SOLN
INTRAMUSCULAR | Status: DC | PRN
  Administered 2017-05-10: 0.2 mg via INTRAVENOUS

## 2017-05-10 MED ORDER — SODIUM CHLORIDE 0.9 % IJ SOLN
INTRAVENOUS | Status: DC | PRN
  Administered 2017-05-10: 5 mL via INTRAMUSCULAR

## 2017-05-10 MED ORDER — PROMETHAZINE HCL 25 MG/ML IJ SOLN: 6.2500 mg | INTRAMUSCULAR | Status: DC | PRN

## 2017-05-10 MED ORDER — CELECOXIB 200 MG PO CAPS
200.0000 mg | ORAL_CAPSULE | ORAL | Status: AC
  Administered 2017-05-10: 200 mg via ORAL
  Filled 2017-05-10: qty 1

## 2017-05-10 MED ORDER — MEPERIDINE HCL 50 MG/ML IJ SOLN: 6.2500 mg | INTRAMUSCULAR | Status: DC | PRN

## 2017-05-10 MED ORDER — PHENYLEPHRINE 40 MCG/ML (10ML) SYRINGE FOR IV PUSH (FOR BLOOD PRESSURE SUPPORT)
PREFILLED_SYRINGE | INTRAVENOUS | Status: DC | PRN
  Administered 2017-05-10 (×3): 40 ug via INTRAVENOUS

## 2017-05-10 MED ORDER — PROPOFOL 10 MG/ML IV BOLUS
INTRAVENOUS | Status: AC
  Filled 2017-05-10: qty 20

## 2017-05-10 MED ORDER — BUPIVACAINE-EPINEPHRINE (PF) 0.25% -1:200000 IJ SOLN
INTRAMUSCULAR | Status: AC
  Filled 2017-05-10: qty 30

## 2017-05-10 MED ORDER — MIDAZOLAM HCL 2 MG/2ML IJ SOLN
INTRAMUSCULAR | Status: AC
  Filled 2017-05-10: qty 2

## 2017-05-10 MED ORDER — GABAPENTIN 300 MG PO CAPS
300.0000 mg | ORAL_CAPSULE | ORAL | Status: AC
  Administered 2017-05-10: 300 mg via ORAL
  Filled 2017-05-10: qty 1

## 2017-05-10 MED ORDER — DEXAMETHASONE SODIUM PHOSPHATE 10 MG/ML IJ SOLN
INTRAMUSCULAR | Status: AC
  Filled 2017-05-10: qty 1

## 2017-05-10 MED ORDER — ACETAMINOPHEN 500 MG PO TABS
1000.0000 mg | ORAL_TABLET | ORAL | Status: AC
  Administered 2017-05-10: 1000 mg via ORAL
  Filled 2017-05-10: qty 2

## 2017-05-10 MED ORDER — HYDROCODONE-ACETAMINOPHEN 5-325 MG PO TABS
2.0000 | ORAL_TABLET | Freq: Once | ORAL | Status: AC
  Administered 2017-05-10: 2 via ORAL

## 2017-05-10 MED ORDER — LIDOCAINE 2% (20 MG/ML) 5 ML SYRINGE
INTRAMUSCULAR | Status: DC | PRN
  Administered 2017-05-10: 60 mg via INTRAVENOUS
  Administered 2017-05-10: 40 mg via INTRAVENOUS

## 2017-05-10 MED ORDER — OXYCODONE HCL 5 MG/5ML PO SOLN: 5.0000 mg | Freq: Once | ORAL | Status: DC | PRN

## 2017-05-10 MED ORDER — HYDROCODONE-ACETAMINOPHEN 5-325 MG PO TABS
ORAL_TABLET | ORAL | Status: AC
  Filled 2017-05-10: qty 2

## 2017-05-10 MED ORDER — SODIUM CHLORIDE 0.9 % IJ SOLN
INTRAMUSCULAR | Status: AC
  Filled 2017-05-10: qty 10

## 2017-05-10 MED ORDER — TECHNETIUM TC 99M SULFUR COLLOID FILTERED
1.0000 | Freq: Once | INTRAVENOUS | Status: AC | PRN
  Administered 2017-05-10: 1 via INTRADERMAL

## 2017-05-10 MED ORDER — PROPOFOL 10 MG/ML IV BOLUS
INTRAVENOUS | Status: DC | PRN
  Administered 2017-05-10: 200 mg via INTRAVENOUS

## 2017-05-10 MED ORDER — LIDOCAINE 2% (20 MG/ML) 5 ML SYRINGE
INTRAMUSCULAR | Status: AC
  Filled 2017-05-10: qty 5

## 2017-05-10 MED ORDER — DEXAMETHASONE SODIUM PHOSPHATE 10 MG/ML IJ SOLN
INTRAMUSCULAR | Status: DC | PRN
  Administered 2017-05-10: 10 mg via INTRAVENOUS

## 2017-05-10 MED ORDER — 0.9 % SODIUM CHLORIDE (POUR BTL) OPTIME
TOPICAL | Status: DC | PRN
  Administered 2017-05-10: 1000 mL

## 2017-05-10 MED ORDER — HYDROMORPHONE HCL 2 MG/ML IJ SOLN: 0.2500 mg | INTRAMUSCULAR | Status: DC | PRN

## 2017-05-10 MED ORDER — METHYLENE BLUE 0.5 % INJ SOLN
INTRAVENOUS | Status: AC
  Filled 2017-05-10: qty 10

## 2017-05-10 MED ORDER — BUPIVACAINE-EPINEPHRINE (PF) 0.5% -1:200000 IJ SOLN
INTRAMUSCULAR | Status: DC | PRN
  Administered 2017-05-10: 30 mL

## 2017-05-10 MED ORDER — ONDANSETRON HCL 4 MG/2ML IJ SOLN
INTRAMUSCULAR | Status: DC | PRN
  Administered 2017-05-10: 4 mg via INTRAVENOUS

## 2017-05-10 MED ORDER — BUPIVACAINE-EPINEPHRINE 0.25% -1:200000 IJ SOLN
INTRAMUSCULAR | Status: DC | PRN
  Administered 2017-05-10: 14 mL

## 2017-05-10 MED ORDER — PHENYLEPHRINE 40 MCG/ML (10ML) SYRINGE FOR IV PUSH (FOR BLOOD PRESSURE SUPPORT)
PREFILLED_SYRINGE | INTRAVENOUS | Status: AC
  Filled 2017-05-10: qty 10

## 2017-05-10 MED ORDER — OXYCODONE HCL 5 MG PO TABS: 5.0000 mg | ORAL_TABLET | Freq: Once | ORAL | Status: DC | PRN

## 2017-05-10 MED ORDER — LACTATED RINGERS IV SOLN: INTRAVENOUS | Status: DC

## 2017-05-10 SURGICAL SUPPLY — 56 items
ADH SKN CLS APL DERMABOND .7 (GAUZE/BANDAGES/DRESSINGS) ×1
ADH SKN CLS LQ APL DERMABOND (GAUZE/BANDAGES/DRESSINGS) ×1
APPLIER CLIP 9.375 MED OPEN (MISCELLANEOUS) ×2
APR CLP MED 9.3 20 MLT OPN (MISCELLANEOUS) ×1
BINDER BREAST LRG (GAUZE/BANDAGES/DRESSINGS) IMPLANT
BINDER BREAST XLRG (GAUZE/BANDAGES/DRESSINGS) ×1 IMPLANT
BLADE SURG 15 STRL LF DISP TIS (BLADE) ×2 IMPLANT
BLADE SURG 15 STRL SS (BLADE) ×4
CANISTER SUCT 3000ML PPV (MISCELLANEOUS) ×2 IMPLANT
CHLORAPREP W/TINT 26ML (MISCELLANEOUS) ×2 IMPLANT
CLIP APPLIE 9.375 MED OPEN (MISCELLANEOUS) ×1 IMPLANT
CONT SPEC 4OZ CLIKSEAL STRL BL (MISCELLANEOUS) ×2 IMPLANT
COVER PROBE W GEL 5X96 (DRAPES) ×2 IMPLANT
COVER SURGICAL LIGHT HANDLE (MISCELLANEOUS) ×2 IMPLANT
DERMABOND ADHESIVE PROPEN (GAUZE/BANDAGES/DRESSINGS) ×1
DERMABOND ADVANCED (GAUZE/BANDAGES/DRESSINGS) ×1
DERMABOND ADVANCED .7 DNX12 (GAUZE/BANDAGES/DRESSINGS) ×1 IMPLANT
DERMABOND ADVANCED .7 DNX6 (GAUZE/BANDAGES/DRESSINGS) IMPLANT
DEVICE DUBIN SPECIMEN MAMMOGRA (MISCELLANEOUS) ×2 IMPLANT
DRAPE CHEST BREAST 15X10 FENES (DRAPES) ×2 IMPLANT
DRAPE HALF SHEET 40X57 (DRAPES) ×2 IMPLANT
DRAPE UTILITY XL STRL (DRAPES) ×2 IMPLANT
DRSG PAD ABDOMINAL 8X10 ST (GAUZE/BANDAGES/DRESSINGS) ×2 IMPLANT
ELECT CAUTERY BLADE 6.4 (BLADE) ×2 IMPLANT
ELECT REM PT RETURN 9FT ADLT (ELECTROSURGICAL)
ELECTRODE REM PT RTRN 9FT ADLT (ELECTROSURGICAL) ×1 IMPLANT
FILTER STRAW FLUID ASPIR (MISCELLANEOUS) ×1 IMPLANT
GAUZE SPONGE 4X4 12PLY STRL (GAUZE/BANDAGES/DRESSINGS) ×2 IMPLANT
GLOVE EUDERMIC 7 POWDERFREE (GLOVE) ×2 IMPLANT
GOWN STRL REUS W/ TWL LRG LVL3 (GOWN DISPOSABLE) ×1 IMPLANT
GOWN STRL REUS W/ TWL XL LVL3 (GOWN DISPOSABLE) ×1 IMPLANT
GOWN STRL REUS W/TWL LRG LVL3 (GOWN DISPOSABLE) ×2
GOWN STRL REUS W/TWL XL LVL3 (GOWN DISPOSABLE) ×2
ILLUMINATOR WAVEGUIDE N/F (MISCELLANEOUS) IMPLANT
KIT BASIN OR (CUSTOM PROCEDURE TRAY) ×2 IMPLANT
KIT MARKER MARGIN INK (KITS) ×2 IMPLANT
LIGHT WAVEGUIDE WIDE FLAT (MISCELLANEOUS) IMPLANT
NDL HYPO 25GX1X1/2 BEV (NEEDLE) ×1 IMPLANT
NDL SAFETY ECLIPSE 18X1.5 (NEEDLE) IMPLANT
NEEDLE HYPO 18GX1.5 SHARP (NEEDLE) ×2
NEEDLE HYPO 25GX1X1/2 BEV (NEEDLE) ×2 IMPLANT
NS IRRIG 1000ML POUR BTL (IV SOLUTION) ×2 IMPLANT
PACK SURGICAL SETUP 50X90 (CUSTOM PROCEDURE TRAY) ×2 IMPLANT
PAD ABD 8X10 STRL (GAUZE/BANDAGES/DRESSINGS) ×2 IMPLANT
PENCIL BUTTON HOLSTER BLD 10FT (ELECTRODE) ×2 IMPLANT
SPONGE LAP 4X18 X RAY DECT (DISPOSABLE) ×4 IMPLANT
SUT MNCRL AB 4-0 PS2 18 (SUTURE) ×4 IMPLANT
SUT SILK 2 0 SH (SUTURE) ×2 IMPLANT
SUT VIC AB 2-0 CT1 36 (SUTURE) ×2 IMPLANT
SUT VIC AB 3-0 SH 18 (SUTURE) ×2 IMPLANT
SYR BULB 3OZ (MISCELLANEOUS) ×2 IMPLANT
SYR CONTROL 10ML LL (SYRINGE) ×3 IMPLANT
TOWEL OR 17X24 6PK STRL BLUE (TOWEL DISPOSABLE) ×1 IMPLANT
TOWEL OR 17X26 10 PK STRL BLUE (TOWEL DISPOSABLE) ×2 IMPLANT
TUBE CONNECTING 12X1/4 (SUCTIONS) ×2 IMPLANT
YANKAUER SUCT BULB TIP NO VENT (SUCTIONS) ×2 IMPLANT

## 2017-05-10 NOTE — Anesthesia Procedure Notes (Signed)
Anesthesia Regional Block: Pectoralis block   Pre-Anesthetic Checklist: ,, timeout performed, Correct Patient, Correct Site, Correct Laterality, Correct Procedure, Correct Position, site marked, Risks and benefits discussed,  Surgical consent,  Pre-op evaluation,  At surgeon's request and post-op pain management  Laterality: Left  Prep: chloraprep       Needles:  Injection technique: Single-shot  Needle Type: Echogenic Needle     Needle Length: 9cm  Needle Gauge: 21     Additional Needles:   Procedures:,,,, ultrasound used (permanent image in chart),,,,  Narrative:  Start time: 05/10/2017 8:10 AM End time: 05/10/2017 8:15 AM Injection made incrementally with aspirations every 5 mL.  Performed by: Personally  Anesthesiologist: Effie Berkshire, MD  Additional Notes: Patient tolerated the procedure well. Local anesthetic introduced in an incremental fashion under minimal resistance after negative aspirations. No paresthesias were elicited. After completion of the procedure, no acute issues were identified and patient continued to be monitored by RN.

## 2017-05-10 NOTE — Op Note (Signed)
Patient Name:           Meredith Donaldson   Date of Surgery:        05/10/2017  Pre op Diagnosis:      Multifocal cancer left breast, lower inner quadrant, estrogen receptor positive  Post op Diagnosis:    Same  Procedure:                 Inject blue dye left breast                                      Left breast lumpectomy with radioactive seed x3                                      Inframammary hidden scar technique                                      Left axillary deep sentinel lymph node biopsy  Surgeon:                     Edsel Petrin. Dalbert Batman, M.D., FACS  Assistant:                      OR staff  Operative Indications:   . This is a 62 year old female who is brought to the operating room for definitive surgery for her multifocal left breast cancer. Itially referred by the [facility]. Her listed PCP as Delman Cheadle and Dr. Wolfgang Phoenix in [place]. Her oncologist is Dr. Jana Hakim.      Imaging studies this year show multiple nodules in the left breast at the 7:30 to 8 o'clock position near the inframammary fold, 12 cm from the nipple. There were 3 or 4 masses in this location. Axillary ultrasound negative. Biopsy of one of these nodules shows invasive ductal carcinoma, grade 2. ER and PR strongly positive. HER-2 negative       We had a long talk when I first saw her on April 04, 2017. I was concerned that she had multifocal cancer but possibly localized to the lower inner left breast. MRI showed some more enhancement anterior to these nodules but very close by. There was no other masses in either breast. There is no axillary adenopathy. Radiology performed an MRI guided biopsy more anterior to this area. This also showed invasive cancer, receptor positive, HER-2 negative. The distance between the 2 postbiopsy markers is 2.6 cm.      It appears that she may have multifocal cancer but very localized to the lower inner quadrant of the left breast at the inframammary fold and just  above this.      We had a very long talk. We talked about lumpectomy, sentinel node biopsy, mastectomy with or without reconstruction She is strongly motivated for breast conservation      We are going to try to do a generous lumpectomy of the left breast, lower inner quadrant placing 3 radioactive seeds and performing a elliptical skin incision and try to get a negative margin. This will be a curvilinear ellipse with 3 seeds but one specimen. Deep axillary sentinel node biopsy will also be performed.. She agrees with this plan.      She knows  that she will likely be offered radiation therapy. Oncotype will be obtained from the final surgical specimen. Dr. Jana Hakim does not anticipate chemotherapy Adjuvant radiation therapy is anticipated Anti-estrogens to follow the completion of local treatment    Operative Findings:       We made a generous inframammary incision in the left breast medial aspect.  We took an ellipse of skin based on the mapping of the radioactive seeds with the neoprobe.  The incision was probably 15 cm long.  The specimen mammogram looked very good containing both marker clips and all 3 radioactive seeds within the center of the specimen.  I took the lumpectomy all the way down to the muscle and so the broad posterior margin is the muscle.  In the left axilla I found 3 sentinel lymph nodes that were sent separately  Procedure in Detail:          Pectoral block and injection of technetium radionuclide was performed in the holding area.  The patient was brought to the operating room and underwent general anesthesia with LMA device.  Surgical timeout was performed.  Intravenous antibiotics were given.  Following alcohol prep I injected 5 cc of dilute methylene blue into the left breast subareolar area and massaged the breast for a few minutes.  The entire left breast axilla and chest wall were then prepped and draped in a sterile fashion.  0.5% Marcaine with epinephrine was used as  a local infiltration anesthetic into the skin and superficial subcutaneous tissues.      I spent some time mapping out the 3 radioactive seeds in the lower inner quadrant of the left breast.  I then fashioned an incision based on the inframammary crease and took an ellipse of skin that was probably at least 3 cm wide at its widest point.  The lumpectomy was performed using electrocautery and the neoprobe.  The specimen was removed and marked with silk sutures and a 6 color ink kit.  The specimen mammogram looked good as described above.  The specimen was sent to the lab where the seeds were retrieved.  The wound was irrigated.  The lumpectomy cavity walls were marked with 6 metal marker clips.  The tissues were closed in several layers with interrupted sutures of 2-0 Vicryl and 3-0 Vicryl and the skin was closed with a running subcuticular 4-0 Monocryl suture and Dermabond.      The neoprobe was set to the technetium setting.  A transverse incision was made in the left axilla at the hairline.  Dissection was carried down through subcutaneous tissue and opening up the clavipectoral fascia.  Using the neoprobe I dissected out 3 obvious sentinel lymph lymph nodes which were hot and blue and that was all that I can find.  These 3 sentinel nodes were sent to the lab.  Hemostasis was excellent.  The wound was irrigated.  The clavipectoral fascia was closed with 3-0 Vicryl sutures and the skin closed with a running subcuticular 4-0 Monocryl and Dermabond.   Addendum: I logged on to the [employer] and reviewed her prescription medication history     Anthonia Monger M. Dalbert Batman, M.D., FACS General and Minimally Invasive Surgery Breast and Colorectal Surgery  05/10/2017 10:13 AM

## 2017-05-10 NOTE — Anesthesia Preprocedure Evaluation (Addendum)
Anesthesia Evaluation  Patient identified by MRN, date of birth, ID band Patient awake    Reviewed: Allergy & Precautions, NPO status , Patient's Chart, lab work & pertinent test results  History of Anesthesia Complications (+) PONV and history of anesthetic complications  Airway Mallampati: II  TM Distance: >3 FB Neck ROM: Full    Dental  (+) Teeth Intact, Dental Advisory Given   Pulmonary neg pulmonary ROS,    breath sounds clear to auscultation       Cardiovascular hypertension, Pt. on home beta blockers and Pt. on medications  Rhythm:Regular Rate:Normal     Neuro/Psych negative neurological ROS  negative psych ROS   GI/Hepatic negative GI ROS, Neg liver ROS,   Endo/Other  negative endocrine ROS  Renal/GU negative Renal ROS     Musculoskeletal negative musculoskeletal ROS (+)   Abdominal (+) + obese,   Peds  Hematology negative hematology ROS (+)   Anesthesia Other Findings   Reproductive/Obstetrics                            Lab Results  Component Value Date   WBC 8.5 05/03/2017   HGB 12.6 05/03/2017   HCT 38.6 05/03/2017   MCV 97.2 05/03/2017   PLT 215 05/03/2017   Lab Results  Component Value Date   CREATININE 0.91 05/03/2017   BUN 14 05/03/2017   NA 141 05/03/2017   K 3.8 05/03/2017   CL 107 05/03/2017   CO2 24 05/03/2017   Lab Results  Component Value Date   INR 1.2 10/16/2006   INR 1.2 10/15/2006   INR 1.1 10/14/2006   EKG: normal sinus rhythm.   Anesthesia Physical Anesthesia Plan  ASA: II  Anesthesia Plan: General   Post-op Pain Management: GA combined w/ Regional for post-op pain   Induction: Intravenous  PONV Risk Score and Plan: 4 or greater and Dexamethasone, Midazolam, Scopolamine patch - Pre-op and Ondansetron  Airway Management Planned: LMA  Additional Equipment: None  Intra-op Plan:   Post-operative Plan: Extubation in OR  Informed  Consent: I have reviewed the patients History and Physical, chart, labs and discussed the procedure including the risks, benefits and alternatives for the proposed anesthesia with the patient or authorized representative who has indicated his/her understanding and acceptance.   Dental advisory given  Plan Discussed with: CRNA  Anesthesia Plan Comments:        Anesthesia Quick Evaluation

## 2017-05-10 NOTE — Discharge Instructions (Signed)
[facility] °Office Phone Number [phone] ° °BREAST BIOPSY/ PARTIAL MASTECTOMY: POST OP INSTRUCTIONS ° °Always review your discharge instruction sheet given to you by the facility where your surgery was performed. ° °IF YOU HAVE DISABILITY OR FAMILY LEAVE FORMS, YOU MUST BRING THEM TO THE OFFICE FOR PROCESSING.  DO NOT GIVE THEM TO YOUR DOCTOR. ° °1. A prescription for pain medication may be given to you upon discharge.  Take your pain medication as prescribed, if needed.  If narcotic pain medicine is not needed, then you may take acetaminophen (Tylenol) or ibuprofen (Advil) as needed. °2. Take your usually prescribed medications unless otherwise directed °3. If you need a refill on your pain medication, please contact your pharmacy.  They will contact our office to request authorization.  Prescriptions will not be filled after 5pm or on week-ends. °4. You should eat very light the first 24 hours after surgery, such as soup, crackers, pudding, etc.  Resume your normal diet the day after surgery. °5. Most patients will experience some swelling and bruising in the breast.  Ice packs and a good support bra will help.  Swelling and bruising can take several days to resolve.  °6. It is common to experience some constipation if taking pain medication after surgery.  Increasing fluid intake and taking a stool softener will usually help or prevent this problem from occurring.  A mild laxative (Milk of Magnesia or Miralax) should be taken according to package directions if there are no bowel movements after 48 hours. °7. Unless discharge instructions indicate otherwise, you may remove your bandages 24-48 hours after surgery, and you may shower at that time.  You may have steri-strips (small skin tapes) in place directly over the incision.  These strips should be left on the skin for 7-10 days.  If your surgeon used skin glue on the incision, you may shower in 24 hours.  The glue will flake off over the  next 2-3 weeks.  Any sutures or staples will be removed at the office during your follow-up visit. °8. ACTIVITIES:  You may resume regular daily activities (gradually increasing) beginning the next day.  Wearing a good support bra or sports bra minimizes pain and swelling.  You may have sexual intercourse when it is comfortable. °a. You may drive when you no longer are taking prescription pain medication, you can comfortably wear a seatbelt, and you can safely maneuver your car and apply brakes. °b. RETURN TO WORK:  ______________________________________________________________________________________ °9. You should see your doctor in the office for a follow-up appointment approximately two weeks after your surgery.  Your doctor’s nurse will typically make your follow-up appointment when she calls you with your pathology report.  Expect your pathology report 2-3 business days after your surgery.  You may call to check if you do not hear from us after three days. °10. OTHER INSTRUCTIONS: _______________________________________________________________________________________________ _____________________________________________________________________________________________________________________________________ °_____________________________________________________________________________________________________________________________________ °_____________________________________________________________________________________________________________________________________ ° °WHEN TO CALL YOUR DOCTOR: °1. Fever over 101.0 °2. Nausea and/or vomiting. °3. Extreme swelling or bruising. °4. Continued bleeding from incision. °5. Increased pain, redness, or drainage from the incision. ° °The clinic staff is available to answer your questions during regular business hours.  Please don’t hesitate to call and ask to speak to one of the nurses for clinical concerns.  If you have a medical emergency, go to the nearest  emergency room or call 911.  A surgeon from [facility] is always on call at the hospital. ° °For further questions, please visit [email or web address]  °

## 2017-05-10 NOTE — Interval H&P Note (Signed)
History and Physical Interval Note:  05/10/2017 8:07 AM  Meredith Donaldson  has presented today for surgery, with the diagnosis of [name]  The various methods of treatment have been discussed with the patient and family. After consideration of risks, benefits and other options for treatment, the patient has consented to  Procedure(s): BREAST LUMPECTOMY WITH RADIOACTIVE SEED X3 AND SENTINEL LYMPH NODE BIOPSY (Left) as a surgical intervention .  The patient's history has been reviewed, patient examined, no change in status, stable for surgery.  I have reviewed the patient's chart and labs.  Questions were answered to the patient's satisfaction.     Adin Hector

## 2017-05-10 NOTE — Transfer of Care (Signed)
Immediate Anesthesia Transfer of Care Note  Patient: Meredith Donaldson  Procedure(s) Performed: BREAST LUMPECTOMY WITH RADIOACTIVE SEED X2 AND SENTINEL LYMPH NODE BIOPSY (Left Breast)  Patient Location: PACU  Anesthesia Type:General and Regional  Level of Consciousness: patient cooperative and responds to stimulation  Airway & Oxygen Therapy: Patient Spontanous Breathing  Post-op Assessment: Report given to RN and Post -op Vital signs reviewed and stable  Post vital signs: Reviewed and stable  Last Vitals:  Vitals Value Taken Time  BP 129/71 05/10/2017 10:17 AM  Temp    Pulse 64 05/10/2017 10:18 AM  Resp 13 05/10/2017 10:18 AM  SpO2 95 % 05/10/2017 10:18 AM  Vitals shown include unvalidated device data.  Last Pain:  Vitals:   05/10/17 1018  TempSrc:   PainSc: (P) 0-No pain         Complications: No apparent anesthesia complications

## 2017-05-10 NOTE — Anesthesia Postprocedure Evaluation (Signed)
Anesthesia Post Note  Patient: Meredith Donaldson  Procedure(s) Performed: BREAST LUMPECTOMY WITH RADIOACTIVE SEED X2 AND SENTINEL LYMPH NODE BIOPSY (Left Breast)     Patient location during evaluation: PACU Anesthesia Type: General Level of consciousness: awake and alert Pain management: pain level controlled Vital Signs Assessment: post-procedure vital signs reviewed and stable Respiratory status: spontaneous breathing, nonlabored ventilation, respiratory function stable and patient connected to nasal cannula oxygen Cardiovascular status: blood pressure returned to baseline and stable Postop Assessment: no apparent nausea or vomiting Anesthetic complications: no    Last Vitals:  Vitals:   05/10/17 1018 05/10/17 1041  BP:  117/72  Pulse:  (!) 52  Resp:  11  Temp: 36.5 C   SpO2:  98%    Last Pain:  Vitals:   05/10/17 1018  TempSrc:   PainSc: 0-No pain                 Effie Berkshire

## 2017-05-11 ENCOUNTER — Encounter (HOSPITAL_COMMUNITY): Payer: Self-pay | Admitting: General Surgery

## 2017-05-12 ENCOUNTER — Telehealth: Payer: Self-pay | Admitting: *Deleted

## 2017-05-12 ENCOUNTER — Telehealth: Payer: Self-pay | Admitting: Oncology

## 2017-05-12 NOTE — Telephone Encounter (Signed)
Patient called to verify her appointment °

## 2017-05-12 NOTE — Progress Notes (Signed)
Inform patient of Pathology report,. All cancers removed from breast with negative margin. All 3 lymph nodes are negative. Good news. She will not need any further surgery. I will discuss in detail at next OV.  PLease let me know that you reached her.  hmi

## 2017-05-12 NOTE — Telephone Encounter (Signed)
Ordered oncotype per Dr. Magrinat.  Faxed requisition to pathology and confirmed receipt.  

## 2017-05-15 ENCOUNTER — Telehealth: Payer: Self-pay | Admitting: *Deleted

## 2017-05-15 ENCOUNTER — Other Ambulatory Visit: Payer: Self-pay | Admitting: Oncology

## 2017-05-15 NOTE — Telephone Encounter (Signed)
Spoke with patient to cancel her appointment with Dr. Jana Hakim for 4/23.  Her oncotype was just sent on 4/19 and will take a couple of weeks for results.  She would like to have her XRT in [place] and we will refer her when we know the results of her oncotype.

## 2017-05-16 ENCOUNTER — Inpatient Hospital Stay: Payer: BLUE CROSS/BLUE SHIELD | Admitting: Oncology

## 2017-05-26 ENCOUNTER — Telehealth: Payer: Self-pay | Admitting: *Deleted

## 2017-05-26 DIAGNOSIS — Z170 Estrogen receptor positive status [ER+]: Secondary | ICD-10-CM | POA: Diagnosis not present

## 2017-05-26 DIAGNOSIS — C50312 Malignant neoplasm of lower-inner quadrant of left female breast: Secondary | ICD-10-CM | POA: Diagnosis not present

## 2017-05-26 NOTE — Telephone Encounter (Signed)
Received oncotype results of 14.  Patient aware. Referral sent to [facility] radiation oncology per patient request.  She will call us when she knows her final XRT so we can get her back in to see Dr. Jana Hakim

## 2017-06-01 ENCOUNTER — Encounter (HOSPITAL_COMMUNITY): Payer: Self-pay | Admitting: Oncology

## 2017-06-01 DIAGNOSIS — D0512 Intraductal carcinoma in situ of left breast: Secondary | ICD-10-CM | POA: Diagnosis not present

## 2017-06-01 DIAGNOSIS — Z1389 Encounter for screening for other disorder: Secondary | ICD-10-CM | POA: Diagnosis not present

## 2017-06-01 DIAGNOSIS — J069 Acute upper respiratory infection, unspecified: Secondary | ICD-10-CM | POA: Diagnosis not present

## 2017-06-01 DIAGNOSIS — Z6838 Body mass index (BMI) 38.0-38.9, adult: Secondary | ICD-10-CM | POA: Diagnosis not present

## 2017-06-06 ENCOUNTER — Other Ambulatory Visit: Payer: Self-pay | Admitting: Oncology

## 2017-06-06 DIAGNOSIS — C50312 Malignant neoplasm of lower-inner quadrant of left female breast: Secondary | ICD-10-CM | POA: Diagnosis not present

## 2017-06-06 DIAGNOSIS — C50912 Malignant neoplasm of unspecified site of left female breast: Secondary | ICD-10-CM | POA: Diagnosis not present

## 2017-06-06 DIAGNOSIS — Z510 Encounter for antineoplastic radiation therapy: Secondary | ICD-10-CM | POA: Diagnosis not present

## 2017-06-06 DIAGNOSIS — Z170 Estrogen receptor positive status [ER+]: Secondary | ICD-10-CM | POA: Diagnosis not present

## 2017-06-09 DIAGNOSIS — Z510 Encounter for antineoplastic radiation therapy: Secondary | ICD-10-CM | POA: Diagnosis not present

## 2017-06-09 DIAGNOSIS — C50312 Malignant neoplasm of lower-inner quadrant of left female breast: Secondary | ICD-10-CM | POA: Diagnosis not present

## 2017-06-09 DIAGNOSIS — C50912 Malignant neoplasm of unspecified site of left female breast: Secondary | ICD-10-CM | POA: Diagnosis not present

## 2017-06-14 ENCOUNTER — Encounter: Payer: Self-pay | Admitting: Radiation Oncology

## 2017-06-14 ENCOUNTER — Ambulatory Visit
Admission: RE | Admit: 2017-06-14 | Discharge: 2017-06-14 | Disposition: A | Discharge status: Home / Self Care | Payer: BLUE CROSS/BLUE SHIELD | Source: Ambulatory Visit | Attending: Radiation Oncology | Admitting: Radiation Oncology

## 2017-06-14 ENCOUNTER — Other Ambulatory Visit: Payer: Self-pay

## 2017-06-14 VITALS — BP 154/71 | HR 62 | Temp 97.8°F | Resp 18 | Ht 68.0 in | Wt 243.8 lb

## 2017-06-14 DIAGNOSIS — Z9889 Other specified postprocedural states: Secondary | ICD-10-CM | POA: Diagnosis not present

## 2017-06-14 DIAGNOSIS — Z170 Estrogen receptor positive status [ER+]: Secondary | ICD-10-CM | POA: Insufficient documentation

## 2017-06-14 DIAGNOSIS — Z510 Encounter for antineoplastic radiation therapy: Secondary | ICD-10-CM | POA: Insufficient documentation

## 2017-06-14 DIAGNOSIS — I10 Essential (primary) hypertension: Secondary | ICD-10-CM | POA: Insufficient documentation

## 2017-06-14 DIAGNOSIS — Z79899 Other long term (current) drug therapy: Secondary | ICD-10-CM | POA: Diagnosis not present

## 2017-06-14 DIAGNOSIS — E785 Hyperlipidemia, unspecified: Secondary | ICD-10-CM | POA: Diagnosis not present

## 2017-06-14 DIAGNOSIS — C50312 Malignant neoplasm of lower-inner quadrant of left female breast: Secondary | ICD-10-CM | POA: Diagnosis not present

## 2017-06-14 NOTE — Progress Notes (Signed)
Location of Breast Cancer: Malignant neoplasm of lower inner quadrant of left breast   Histology per Pathology Report:  Left Breast 03/28/2017  Left Breast 04/17/2017   Left Breast Lumpectomy 05/10/2017   Receptor Status: ER(+ 95%), PR (+ 95%), Her2-neu (-), Ki-67(5%)  Did patient present with symptoms (if so, please note symptoms) or was this found on screening mammography?:  Routine mammogram on 03/03/2017: Possible abnormality in the left breast. Diagnostic mammogram with tomography and left breast ultrasonography at [facility] 03/14/2017: Breast density category B.  There were multiple hypoechoic masses of varying sizes in the lower inner quadrant of the left breast found sonographically.  A dominant mass at the 7:30 position measuring 0.9 x 0.9 x 0.8 cm near the inframammary fold.  Another mass at the 7:30 slightly deep and lateral measuring 0.4 x 0.3 cm.  At 8 o'clock position near the medial margin of the breast is a second dominant mass measuring 0.8 x 0.8 x 0.9 cm.  Slightly medial to this is a similar appearing hypoechoic mass measuring 0.4 x 0.4 x 0.5 cm.  Immediately lateral to the dominant mass at 8:00 position, located 12 cm from the nipple is a 0.4 x 0.4 x 0.5 cm mass. Slightly more superficially at 8 o'clock position 12 cm from the nipple is an irregular hypoechoic mass measuring 0.7 cm maximum diameter. Ultrasound of the left axilla was negative for lymphadenopathy.    Past/Anticipated interventions by surgeon, if any: Dr. Dalbert Batman 05/07/2017 -We are going to try to do a generous lumpectomy of the left breast, lower inner quadrant placing 2 radioactive seeds and performing a elliptical skin incision and try to get a negative margin. -Deep axillary sentinel node biopsy will also be performed. -Oncotype will be obtained from the final surgical specimen.  -Dr. Jana Hakim does not anticipate chemotherapy -Adjuvant radiation therapy is anticipated -Anti-estrogens to follow the completion  of local treatment  Dr. Dalbert Batman 05/10/2017 Inform patient of Pathology report,. All cancers removed from breast with negative margin. All 3 lymph nodes are negative. Good news. She will not need any further surgery  Past/Anticipated interventions by medical oncology, if any: Chemotherapy  Dr. Jana Hakim 04/13/2017 -The overall plan then is to complete the planning for surgery, and then decide what surgery she will undergo.  I believe this can be accomplished in the next 2-3 weeks.  Oncotype testing takes an additional 2 weeks so I am requesting an appointment for her here with me in about 6 weeks. -Appointment scheduled for 07/24/2017 at 10 am  Oncotype results 05/26/2017: 14  Lymphedema issues, if any:  Maybe a little under the arm.  Pain issues, if any:  None  BP (!) 154/71 (BP Location: Right Arm, Patient Position: Sitting, Cuff Size: Normal)   Pulse 62   Temp 97.8 F (36.6 C) (Oral)   Resp 18   Ht 5' 8" (1.727 m)   Wt 243 lb 12.8 oz (110.6 kg)   SpO2 98%   BMI 37.07 kg/m    Wt Readings from Last 3 Encounters:  06/14/17 243 lb 12.8 oz (110.6 kg)  05/10/17 241 lb (109.3 kg)  05/03/17 243 lb (110.2 kg)   SAFETY ISSUES:  Prior radiation? No  Pacemaker/ICD? No  Possible current pregnancy? No  Is the patient on methotrexate? No  Current Complaints / other details:      Cori Razor, RN 06/14/2017,8:28 AM

## 2017-06-14 NOTE — Progress Notes (Addendum)
Radiation Oncology         (336) [phone] ________________________________  Name: Meredith Donaldson        MRN: [id number]  Date of Service: 06/14/2017 DOB: 03-27-1955  XN:[id number], Meredith Shouts, PA-C  Meredith Skates, MD     REFERRING PHYSICIAN: Fanny Skates, MD    Addendum:   Please see the note from Meredith Simpson, PA-C from today's visit for more details of today's encounter.  I have personally performed a face to face diagnostic evaluation on this patient and devised the following assessment and plan.  The patient returns to clinic today for follow-up for her diagnosis of breast cancer. The patient has completed a lumpectomy and will not receive chemotherapy. The patient is appropriate to proceed with adjuvant radiation treatment at this time.  The patient initially was seen in [facility] at [facility] to proceed with adjuvant radiation treatment.  However due to her anatomy it was felt that cardiac sparing techniques such as breath-hold technique would be advantageous for her.  Therefore I have been asked to see the patient today for discussion of adjuvant radiation treatment here in [place].  We discussed proceeding with a course of radiation treatment to the left breast using tangent whole breast radiation treatment fields for 4 weeks. All of her questions were answered and she wishes to proceed with treatment. The patient will proceed with simulation for treatment planning.  Staging - pT2N0M0 IDC left breast   Meredith Rudd, MD      DIAGNOSIS: The encounter diagnosis was Malignant neoplasm of lower-inner quadrant of left breast in female, estrogen receptor positive ([place]).   HISTORY OF PRESENT ILLNESS: Meredith Donaldson is a 62 y.o. female seen at the request of Dr. Allegra Donaldson in [place] at [facility] for a recently diagnosed left breast cancer. The patient was found to have mass in the left breast on screening mammography in February 2019. She had a  dominant mass at 7:30 measuring 9 x 9 x 8 mm and a second dominant at 8:00 mass measuring 8 x 8x 9 mm. She had several other lesions seen and underwent multiple biopsies. She was diagnosed with ER PR positive HER-2 negative invasive ductal carcinoma with papillary features of the left breast at 8:00 on 03/28/2017.  Biopsy on 04/17/2017 revealed ER PR positive HER-2 negative invasive mammary carcinoma with extracellular mucin in the lower inner quadrant.  She was counseled on the role for lumpectomy and on 05/10/17, this revealed multifocal invasive ductal carcinoma with extracellular mucin grade 1 with the largest area spanning 2.5 cm, with low-grade DCIS, resection margins were negative by 1 cm from invasive as well as in situ disease.  She had 3 lymph nodes from the left axilla removed that were all negative for disease as well.  Her Oncotype score was 14 and she does not require chemotherapy.  She comes today to discuss the options of radiotherapy of note she did meet with Meredith Donaldson in [place], and due to the limitations of not having deep inspiration breath-hold, she did not proceed with treatment there.  She did undergo simulation and has tattoos in situ.   PREVIOUS RADIATION THERAPY: No   PAST MEDICAL HISTORY:  Past Medical History:  Diagnosis Date  . Cataracts, bilateral   . Complication of anesthesia   . Hyperlipidemia   . Hypertension   . PONV (postoperative nausea and vomiting)        PAST SURGICAL HISTORY: Past Surgical History:  Procedure Laterality Date  .  BREAST BIOPSY Left 03/28/2017  . BREAST BIOPSY Left 04/13/2017  . BREAST LUMPECTOMY WITH RADIOACTIVE SEED AND SENTINEL LYMPH NODE BIOPSY Left 05/10/2017   Procedure: BREAST LUMPECTOMY WITH RADIOACTIVE SEED X2 AND SENTINEL LYMPH NODE BIOPSY;  Surgeon: Meredith Skates, MD;  Location: [facility];  Service: General;  Laterality: Left;  . CESAREAN SECTION    . FRACTURE SURGERY Right 2008   right leg fracture s/p pinning     FAMILY  HISTORY:  Family History  Problem Relation Age of Onset  . Heart attack Father   . Cancer Neg Hx      SOCIAL HISTORY:  reports that she has never smoked. She has never used smokeless tobacco. She reports that she does not drink alcohol or use drugs.  The patient is engaged, she lives in [place].   ALLERGIES: Penicillins and Sulfa antibiotics   MEDICATIONS:  Current Outpatient Medications  Medication Sig Dispense Refill  . acetaminophen (TYLENOL) 500 MG tablet Take 1,000 mg by mouth every 6 (six) hours as needed for moderate pain or headache.     . bisoprolol-hydrochlorothiazide (ZIAC) 10-6.25 MG tablet Take 1 tablet by mouth daily.  1  . Omega-3 Fatty Acids (FISH OIL) 1200 MG CAPS Take 2,400 mg by mouth daily before lunch.     . vitamin C (ASCORBIC ACID) 500 MG tablet Take 500 mg by mouth daily before lunch.     . vitamin E 400 UNIT capsule Take 400 Units by mouth daily before lunch.    Marland Kitchen HYDROcodone-acetaminophen (NORCO) 5-325 MG tablet Take 1-2 tablets by mouth every 6 (six) hours as needed for moderate pain or severe pain. (Patient not taking: Reported on 06/14/2017) 25 tablet 0   No current facility-administered medications for this encounter.      REVIEW OF SYSTEMS: On review of systems, the patient reports that she is doing well overall. She denies any chest pain, shortness of breath, cough, fevers, chills, night sweats, unintended weight changes. She denies any bowel or bladder disturbances, and denies abdominal pain, nausea or vomiting. She denies any new musculoskeletal or joint aches or pains. A complete review of systems is obtained and is otherwise negative.     PHYSICAL EXAM:  Wt Readings from Last 3 Encounters:  06/14/17 243 lb 12.8 oz (110.6 kg)  05/10/17 241 lb (109.3 kg)  05/03/17 243 lb (110.2 kg)   Temp Readings from Last 3 Encounters:  06/14/17 97.8 F (36.6 C) (Oral)  05/10/17 97.7 F (36.5 C)  05/03/17 98.2 F (36.8 C) (Oral)   BP Readings from  Last 3 Encounters:  06/14/17 (!) 154/71  05/10/17 136/73  05/03/17 (!) 143/76   Pulse Readings from Last 3 Encounters:  06/14/17 62  05/10/17 (!) 52  05/03/17 (!) 56   Pain Assessment Pain Score: 0-No pain/10  In general this is a well appearing caucasian female in no acute distress. She is alert and oriented x4 and appropriate throughout the examination. HEENT reveals that the patient is normocephalic, atraumatic. EOMs are intact. Skin is intact without any evidence of gross lesions. Cardiopulmonary assessment is negative for acute distress and she exhibits normal effort. The left breast incision site is well healed as is her axillary site. No cellulitic appearing changes are noted.    ECOG = 0  0 - Asymptomatic (Fully active, able to carry on all predisease activities without restriction)  1 - Symptomatic but completely ambulatory (Restricted in physically strenuous activity but ambulatory and able to carry out work of a light or  sedentary nature. For example, light housework, office work)  2 - Symptomatic, <50% in bed during the day (Ambulatory and capable of all self care but unable to carry out any work activities. Up and about more than 50% of waking hours)  3 - Symptomatic, >50% in bed, but not bedbound (Capable of only limited self-care, confined to bed or chair 50% or more of waking hours)  4 - Bedbound (Completely disabled. Cannot carry on any self-care. Totally confined to bed or chair)  5 - Death   Eustace Pen MM, Creech RH, Tormey DC, et al. [phone]). "Toxicity and response criteria of the [facility]". [place] Oncol. 5 (6): 649-55    LABORATORY DATA:  Lab Results  Component Value Date   WBC 8.5 05/03/2017   HGB 12.6 05/03/2017   HCT 38.6 05/03/2017   MCV 97.2 05/03/2017   PLT 215 05/03/2017   Lab Results  Component Value Date   NA 141 05/03/2017   K 3.8 05/03/2017   CL 107 05/03/2017   CO2 24 05/03/2017   Lab Results  Component Value  Date   ALT 17 05/03/2017   AST 19 05/03/2017   ALKPHOS 49 05/03/2017   BILITOT 1.7 (H) 05/03/2017      RADIOGRAPHY: No results found.     IMPRESSION/PLAN: 1. Stage IA, pT2N0M0 grade 2 ER/PR positive, invasive ductal carcinoma of the left breast. Dr. Lisbeth Renshaw discusses the pathology findings and reviews the nature of invasive breast disease. She has healed well and will not need systemic therapy. We discussed the risks, benefits, short, and long term effects of radiotherapy, and the patient is interested in proceeding. Dr. Lisbeth Renshaw discusses the delivery and logistics of radiotherapy and anticipates a course of 4 weeks of radiotherapy. We will coordinate simulation in the coming days, and begin treatment in the next week or two. Written consent is obtained and placed in the chart, a copy was provided to the patient.  In a visit lasting 60 minutes, greater than 50% of the time was spent face to face discussing her case, and coordinating the patient's care.  The above documentation reflects my direct findings during this shared patient visit. Please see the separate note by Dr. Lisbeth Renshaw on this date for the remainder of the patient's plan of care.    Carola Rhine, PAC

## 2017-06-15 NOTE — Addendum Note (Signed)
Encounter addended by: Kyung Rudd, MD on: 06/15/2017 5:42 PM  Actions taken: Sign clinical note

## 2017-06-22 ENCOUNTER — Ambulatory Visit
Admission: RE | Admit: 2017-06-22 | Discharge: 2017-06-22 | Disposition: A | Discharge status: Home / Self Care | Payer: BLUE CROSS/BLUE SHIELD | Source: Ambulatory Visit | Attending: Radiation Oncology | Admitting: Radiation Oncology

## 2017-06-22 DIAGNOSIS — Z170 Estrogen receptor positive status [ER+]: Principal | ICD-10-CM

## 2017-06-22 DIAGNOSIS — C50312 Malignant neoplasm of lower-inner quadrant of left female breast: Secondary | ICD-10-CM

## 2017-06-22 DIAGNOSIS — I10 Essential (primary) hypertension: Secondary | ICD-10-CM | POA: Diagnosis not present

## 2017-06-22 DIAGNOSIS — Z510 Encounter for antineoplastic radiation therapy: Secondary | ICD-10-CM | POA: Diagnosis not present

## 2017-06-22 DIAGNOSIS — Z79899 Other long term (current) drug therapy: Secondary | ICD-10-CM | POA: Diagnosis not present

## 2017-06-22 DIAGNOSIS — E785 Hyperlipidemia, unspecified: Secondary | ICD-10-CM | POA: Diagnosis not present

## 2017-06-23 DIAGNOSIS — Z170 Estrogen receptor positive status [ER+]: Secondary | ICD-10-CM | POA: Diagnosis not present

## 2017-06-23 DIAGNOSIS — Z79899 Other long term (current) drug therapy: Secondary | ICD-10-CM | POA: Diagnosis not present

## 2017-06-23 DIAGNOSIS — I10 Essential (primary) hypertension: Secondary | ICD-10-CM | POA: Diagnosis not present

## 2017-06-23 DIAGNOSIS — E785 Hyperlipidemia, unspecified: Secondary | ICD-10-CM | POA: Diagnosis not present

## 2017-06-23 DIAGNOSIS — Z510 Encounter for antineoplastic radiation therapy: Secondary | ICD-10-CM | POA: Diagnosis not present

## 2017-06-23 DIAGNOSIS — C50312 Malignant neoplasm of lower-inner quadrant of left female breast: Secondary | ICD-10-CM | POA: Diagnosis not present

## 2017-06-23 NOTE — Progress Notes (Signed)
  Radiation Oncology         (336) [phone] ________________________________  Name: RUNA WHITTINGHAM MRN: [id number]  Date: 06/22/2017  DOB: 10/24/1955  Optical Surface Tracking Plan:  Since intensity modulated radiotherapy (IMRT) and 3D conformal radiation treatment methods are predicated on accurate and precise positioning for treatment, intrafraction motion monitoring is medically necessary to ensure accurate and safe treatment delivery.  The ability to quantify intrafraction motion without excessive ionizing radiation dose can only be performed with optical surface tracking. Accordingly, surface imaging offers the opportunity to obtain 3D measurements of patient position throughout IMRT and 3D treatments without excessive radiation exposure.  I am ordering optical surface tracking for this patient's upcoming course of radiotherapy. ________________________________  Kyung Rudd, MD 06/23/2017 8:10 AM    Reference:   Ursula Alert, J, et al. Surface imaging-based analysis of intrafraction motion for breast radiotherapy patients.Journal of [place], n. 6, nov. 2014. ISSN [id number].   Available at: <http://www.jacmp.org/index.php/jacmp/article/view/4957>.

## 2017-06-23 NOTE — Progress Notes (Signed)
  Radiation Oncology         [phone] ________________________________  Name: Meredith Donaldson MRN: [id number]  Date: 06/22/2017  DOB: [birth year]   DIAGNOSIS:     ICD-10-CM   1. Malignant neoplasm of lower-inner quadrant of left breast in female, estrogen receptor positive ([place]) C50.312    Z17.0     SIMULATION AND TREATMENT PLANNING NOTE  The patient presented for simulation prior to beginning her course of radiation treatment for her diagnosis of left-sided breast cancer. The patient was placed in a supine position on a breast board. A customized vac-lock bag was constructed and this complex treatment device will be used on a daily basis during her treatment. In this fashion, a CT scan was obtained through the chest area and an isocenter was placed near the chest wall within the breast.  The patient will be planned to receive a course of radiation initially to a dose of 42.5 Gy. This will consist of a whole breast radiotherapy technique. To accomplish this, 2 customized blocks have been designed which will correspond to medial and lateral whole breast tangent fields. This treatment will be accomplished at 2.5 Gy per fraction. A forward planning technique will also be evaluated to determine if this approach improves the plan. It is anticipated that the patient will then receive a 7.5 Gy boost to the seroma cavity which has been contoured. This will be accomplished at 2.5 Gy per fraction.   This initial treatment will consist of a 3-D conformal technique. The seroma has been contoured as the primary target structure. Additionally, dose volume histograms of both this target as well as the lungs and heart will also be evaluated. Such an approach is necessary to ensure that the target area is adequately covered while the nearby critical  normal structures are adequately spared.  Plan:  The final anticipated total dose therefore will correspond to 50 Gy.   Special treatment procedure was  performed today due to the extra time and effort required by myself to plan and prepare this patient for deep inspiration breath hold technique.  I have determined cardiac sparing to be of benefit to this patient to prevent long term cardiac damage due to radiation of the heart.  Bellows were placed on the patient's abdomen. To facilitate cardiac sparing, the patient was coached by the radiation therapists on breath hold techniques and breathing practice was performed. Practice waveforms were obtained. The patient was then scanned while maintaining breath hold in the treatment position.  This image was then transferred over to the imaging specialist. The imaging specialist then created a fusion of the free breathing and breath hold scans using the chest wall as the stable structure. I personally reviewed the fusion in axial, coronal and sagittal image planes.  Excellent cardiac sparing was obtained.  I felt the patient is an appropriate candidate for breath hold and the patient will be treated as such.  The image fusion was then reviewed with the patient to reinforce the necessity of reproducible breath hold.     _______________________________   Jodelle Gross, MD, PhD

## 2017-06-26 ENCOUNTER — Ambulatory Visit
Admission: RE | Admit: 2017-06-26 | Discharge: 2017-06-26 | Disposition: A | Discharge status: Home / Self Care | Payer: BLUE CROSS/BLUE SHIELD | Source: Ambulatory Visit | Attending: Radiation Oncology | Admitting: Radiation Oncology

## 2017-06-26 DIAGNOSIS — Z170 Estrogen receptor positive status [ER+]: Secondary | ICD-10-CM | POA: Insufficient documentation

## 2017-06-26 DIAGNOSIS — C50312 Malignant neoplasm of lower-inner quadrant of left female breast: Secondary | ICD-10-CM | POA: Diagnosis not present

## 2017-06-26 DIAGNOSIS — Z510 Encounter for antineoplastic radiation therapy: Secondary | ICD-10-CM | POA: Insufficient documentation

## 2017-06-27 ENCOUNTER — Ambulatory Visit
Admission: RE | Admit: 2017-06-27 | Discharge: 2017-06-27 | Disposition: A | Discharge status: Home / Self Care | Payer: BLUE CROSS/BLUE SHIELD | Source: Ambulatory Visit | Attending: Radiation Oncology | Admitting: Radiation Oncology

## 2017-06-27 DIAGNOSIS — Z510 Encounter for antineoplastic radiation therapy: Secondary | ICD-10-CM | POA: Diagnosis not present

## 2017-06-27 DIAGNOSIS — C50312 Malignant neoplasm of lower-inner quadrant of left female breast: Secondary | ICD-10-CM | POA: Diagnosis not present

## 2017-06-27 DIAGNOSIS — Z170 Estrogen receptor positive status [ER+]: Secondary | ICD-10-CM | POA: Diagnosis not present

## 2017-06-28 ENCOUNTER — Ambulatory Visit
Admission: RE | Admit: 2017-06-28 | Discharge: 2017-06-28 | Disposition: A | Discharge status: Home / Self Care | Payer: BLUE CROSS/BLUE SHIELD | Source: Ambulatory Visit | Attending: Radiation Oncology | Admitting: Radiation Oncology

## 2017-06-28 DIAGNOSIS — Z510 Encounter for antineoplastic radiation therapy: Secondary | ICD-10-CM | POA: Diagnosis not present

## 2017-06-28 DIAGNOSIS — Z170 Estrogen receptor positive status [ER+]: Secondary | ICD-10-CM | POA: Diagnosis not present

## 2017-06-28 DIAGNOSIS — C50312 Malignant neoplasm of lower-inner quadrant of left female breast: Secondary | ICD-10-CM | POA: Diagnosis not present

## 2017-06-29 ENCOUNTER — Ambulatory Visit: Payer: BLUE CROSS/BLUE SHIELD | Admitting: Radiation Oncology

## 2017-06-29 ENCOUNTER — Ambulatory Visit
Admission: RE | Admit: 2017-06-29 | Discharge: 2017-06-29 | Disposition: A | Discharge status: Home / Self Care | Payer: BLUE CROSS/BLUE SHIELD | Source: Ambulatory Visit | Attending: Radiation Oncology | Admitting: Radiation Oncology

## 2017-06-29 DIAGNOSIS — Z170 Estrogen receptor positive status [ER+]: Secondary | ICD-10-CM | POA: Diagnosis not present

## 2017-06-29 DIAGNOSIS — Z510 Encounter for antineoplastic radiation therapy: Secondary | ICD-10-CM | POA: Diagnosis not present

## 2017-06-29 DIAGNOSIS — C50312 Malignant neoplasm of lower-inner quadrant of left female breast: Secondary | ICD-10-CM | POA: Diagnosis not present

## 2017-06-30 ENCOUNTER — Ambulatory Visit
Admission: RE | Admit: 2017-06-30 | Discharge: 2017-06-30 | Disposition: A | Discharge status: Home / Self Care | Payer: BLUE CROSS/BLUE SHIELD | Source: Ambulatory Visit | Attending: Radiation Oncology | Admitting: Radiation Oncology

## 2017-06-30 DIAGNOSIS — Z510 Encounter for antineoplastic radiation therapy: Secondary | ICD-10-CM | POA: Diagnosis not present

## 2017-06-30 DIAGNOSIS — C50312 Malignant neoplasm of lower-inner quadrant of left female breast: Secondary | ICD-10-CM

## 2017-06-30 DIAGNOSIS — Z170 Estrogen receptor positive status [ER+]: Principal | ICD-10-CM

## 2017-06-30 MED ORDER — RADIAPLEXRX EX GEL
Freq: Once | CUTANEOUS | Status: AC
  Administered 2017-06-30: 11:00:00 via TOPICAL

## 2017-06-30 MED ORDER — ALRA NON-METALLIC DEODORANT (RAD-ONC)
1.0000 "application " | Freq: Once | TOPICAL | Status: AC
  Administered 2017-06-30: 1 via TOPICAL

## 2017-06-30 NOTE — Progress Notes (Signed)
Pt here for patient teaching.  Pt given Radiation and You booklet, skin care instructions, Alra deodorant and Radiaplex gel.  Reviewed areas of pertinence such as fatigue, hair loss, skin changes, breast tenderness and breast swelling . Pt able to give teach back of to pat skin and use unscented/gentle soap,apply Radiaplex bid, avoid applying anything to skin within 4 hours of treatment, avoid wearing an under wire bra and to use an electric razor if they must shave. Pt demonstrated understanding, needs reinforcement, no evidence of learning, refused teaching and  of information given and will contact nursing with any questions or concerns.     Http://rtanswers.org/treatmentinformation/whattoexpect/index      

## 2017-07-03 ENCOUNTER — Ambulatory Visit
Admission: RE | Admit: 2017-07-03 | Discharge: 2017-07-03 | Disposition: A | Discharge status: Home / Self Care | Payer: BLUE CROSS/BLUE SHIELD | Source: Ambulatory Visit | Attending: Radiation Oncology | Admitting: Radiation Oncology

## 2017-07-03 DIAGNOSIS — Z170 Estrogen receptor positive status [ER+]: Secondary | ICD-10-CM | POA: Diagnosis not present

## 2017-07-03 DIAGNOSIS — C50312 Malignant neoplasm of lower-inner quadrant of left female breast: Secondary | ICD-10-CM | POA: Diagnosis not present

## 2017-07-03 DIAGNOSIS — Z510 Encounter for antineoplastic radiation therapy: Secondary | ICD-10-CM | POA: Diagnosis not present

## 2017-07-04 ENCOUNTER — Ambulatory Visit
Admission: RE | Admit: 2017-07-04 | Discharge: 2017-07-04 | Disposition: A | Discharge status: Home / Self Care | Payer: BLUE CROSS/BLUE SHIELD | Source: Ambulatory Visit | Attending: Radiation Oncology | Admitting: Radiation Oncology

## 2017-07-04 DIAGNOSIS — Z510 Encounter for antineoplastic radiation therapy: Secondary | ICD-10-CM | POA: Diagnosis not present

## 2017-07-04 DIAGNOSIS — Z170 Estrogen receptor positive status [ER+]: Secondary | ICD-10-CM | POA: Diagnosis not present

## 2017-07-04 DIAGNOSIS — C50312 Malignant neoplasm of lower-inner quadrant of left female breast: Secondary | ICD-10-CM | POA: Diagnosis not present

## 2017-07-05 ENCOUNTER — Ambulatory Visit
Admission: RE | Admit: 2017-07-05 | Discharge: 2017-07-05 | Disposition: A | Discharge status: Home / Self Care | Payer: BLUE CROSS/BLUE SHIELD | Source: Ambulatory Visit | Attending: Radiation Oncology | Admitting: Radiation Oncology

## 2017-07-05 DIAGNOSIS — C50312 Malignant neoplasm of lower-inner quadrant of left female breast: Secondary | ICD-10-CM | POA: Diagnosis not present

## 2017-07-05 DIAGNOSIS — Z510 Encounter for antineoplastic radiation therapy: Secondary | ICD-10-CM | POA: Diagnosis not present

## 2017-07-05 DIAGNOSIS — Z170 Estrogen receptor positive status [ER+]: Secondary | ICD-10-CM | POA: Diagnosis not present

## 2017-07-06 ENCOUNTER — Ambulatory Visit
Admission: RE | Admit: 2017-07-06 | Discharge: 2017-07-06 | Disposition: A | Discharge status: Home / Self Care | Payer: BLUE CROSS/BLUE SHIELD | Source: Ambulatory Visit | Attending: Radiation Oncology | Admitting: Radiation Oncology

## 2017-07-06 DIAGNOSIS — C50312 Malignant neoplasm of lower-inner quadrant of left female breast: Secondary | ICD-10-CM | POA: Diagnosis not present

## 2017-07-06 DIAGNOSIS — Z170 Estrogen receptor positive status [ER+]: Secondary | ICD-10-CM | POA: Diagnosis not present

## 2017-07-06 DIAGNOSIS — Z510 Encounter for antineoplastic radiation therapy: Secondary | ICD-10-CM | POA: Diagnosis not present

## 2017-07-07 ENCOUNTER — Ambulatory Visit
Admission: RE | Admit: 2017-07-07 | Discharge: 2017-07-07 | Disposition: A | Discharge status: Home / Self Care | Payer: BLUE CROSS/BLUE SHIELD | Source: Ambulatory Visit | Attending: Radiation Oncology | Admitting: Radiation Oncology

## 2017-07-07 DIAGNOSIS — Z170 Estrogen receptor positive status [ER+]: Secondary | ICD-10-CM | POA: Diagnosis not present

## 2017-07-07 DIAGNOSIS — C50312 Malignant neoplasm of lower-inner quadrant of left female breast: Secondary | ICD-10-CM | POA: Diagnosis not present

## 2017-07-07 DIAGNOSIS — Z510 Encounter for antineoplastic radiation therapy: Secondary | ICD-10-CM | POA: Diagnosis not present

## 2017-07-10 ENCOUNTER — Ambulatory Visit
Admission: RE | Admit: 2017-07-10 | Discharge: 2017-07-10 | Disposition: A | Discharge status: Home / Self Care | Payer: BLUE CROSS/BLUE SHIELD | Source: Ambulatory Visit | Attending: Radiation Oncology | Admitting: Radiation Oncology

## 2017-07-10 DIAGNOSIS — Z510 Encounter for antineoplastic radiation therapy: Secondary | ICD-10-CM | POA: Diagnosis not present

## 2017-07-10 DIAGNOSIS — Z170 Estrogen receptor positive status [ER+]: Secondary | ICD-10-CM | POA: Diagnosis not present

## 2017-07-10 DIAGNOSIS — C50312 Malignant neoplasm of lower-inner quadrant of left female breast: Secondary | ICD-10-CM | POA: Diagnosis not present

## 2017-07-11 ENCOUNTER — Ambulatory Visit
Admission: RE | Admit: 2017-07-11 | Discharge: 2017-07-11 | Disposition: A | Discharge status: Home / Self Care | Payer: BLUE CROSS/BLUE SHIELD | Source: Ambulatory Visit | Attending: Radiation Oncology | Admitting: Radiation Oncology

## 2017-07-11 DIAGNOSIS — Z170 Estrogen receptor positive status [ER+]: Secondary | ICD-10-CM | POA: Diagnosis not present

## 2017-07-11 DIAGNOSIS — C50312 Malignant neoplasm of lower-inner quadrant of left female breast: Secondary | ICD-10-CM | POA: Diagnosis not present

## 2017-07-11 DIAGNOSIS — Z510 Encounter for antineoplastic radiation therapy: Secondary | ICD-10-CM | POA: Diagnosis not present

## 2017-07-12 ENCOUNTER — Ambulatory Visit
Admission: RE | Admit: 2017-07-12 | Discharge: 2017-07-12 | Disposition: A | Discharge status: Home / Self Care | Payer: BLUE CROSS/BLUE SHIELD | Source: Ambulatory Visit | Attending: Radiation Oncology | Admitting: Radiation Oncology

## 2017-07-12 DIAGNOSIS — Z510 Encounter for antineoplastic radiation therapy: Secondary | ICD-10-CM | POA: Diagnosis not present

## 2017-07-12 DIAGNOSIS — Z170 Estrogen receptor positive status [ER+]: Secondary | ICD-10-CM | POA: Diagnosis not present

## 2017-07-12 DIAGNOSIS — C50312 Malignant neoplasm of lower-inner quadrant of left female breast: Secondary | ICD-10-CM | POA: Diagnosis not present

## 2017-07-13 ENCOUNTER — Ambulatory Visit
Admission: RE | Admit: 2017-07-13 | Discharge: 2017-07-13 | Disposition: A | Discharge status: Home / Self Care | Payer: BLUE CROSS/BLUE SHIELD | Source: Ambulatory Visit | Attending: Radiation Oncology | Admitting: Radiation Oncology

## 2017-07-13 DIAGNOSIS — C50312 Malignant neoplasm of lower-inner quadrant of left female breast: Secondary | ICD-10-CM | POA: Diagnosis not present

## 2017-07-13 DIAGNOSIS — Z170 Estrogen receptor positive status [ER+]: Secondary | ICD-10-CM | POA: Diagnosis not present

## 2017-07-13 DIAGNOSIS — Z510 Encounter for antineoplastic radiation therapy: Secondary | ICD-10-CM | POA: Diagnosis not present

## 2017-07-14 ENCOUNTER — Ambulatory Visit
Admission: RE | Admit: 2017-07-14 | Discharge: 2017-07-14 | Disposition: A | Discharge status: Home / Self Care | Payer: BLUE CROSS/BLUE SHIELD | Source: Ambulatory Visit | Attending: Radiation Oncology | Admitting: Radiation Oncology

## 2017-07-14 DIAGNOSIS — Z510 Encounter for antineoplastic radiation therapy: Secondary | ICD-10-CM | POA: Diagnosis not present

## 2017-07-14 DIAGNOSIS — C50312 Malignant neoplasm of lower-inner quadrant of left female breast: Secondary | ICD-10-CM | POA: Diagnosis not present

## 2017-07-14 DIAGNOSIS — Z170 Estrogen receptor positive status [ER+]: Secondary | ICD-10-CM | POA: Diagnosis not present

## 2017-07-17 ENCOUNTER — Ambulatory Visit
Admission: RE | Admit: 2017-07-17 | Discharge: 2017-07-17 | Disposition: A | Discharge status: Home / Self Care | Payer: BLUE CROSS/BLUE SHIELD | Source: Ambulatory Visit | Attending: Radiation Oncology | Admitting: Radiation Oncology

## 2017-07-17 DIAGNOSIS — Z510 Encounter for antineoplastic radiation therapy: Secondary | ICD-10-CM | POA: Diagnosis not present

## 2017-07-17 DIAGNOSIS — C50312 Malignant neoplasm of lower-inner quadrant of left female breast: Secondary | ICD-10-CM | POA: Diagnosis not present

## 2017-07-17 DIAGNOSIS — Z170 Estrogen receptor positive status [ER+]: Secondary | ICD-10-CM | POA: Diagnosis not present

## 2017-07-18 ENCOUNTER — Ambulatory Visit
Admission: RE | Admit: 2017-07-18 | Discharge: 2017-07-18 | Disposition: A | Discharge status: Home / Self Care | Payer: BLUE CROSS/BLUE SHIELD | Source: Ambulatory Visit | Attending: Radiation Oncology | Admitting: Radiation Oncology

## 2017-07-18 DIAGNOSIS — Z510 Encounter for antineoplastic radiation therapy: Secondary | ICD-10-CM | POA: Diagnosis not present

## 2017-07-18 DIAGNOSIS — Z170 Estrogen receptor positive status [ER+]: Secondary | ICD-10-CM | POA: Diagnosis not present

## 2017-07-18 DIAGNOSIS — C50312 Malignant neoplasm of lower-inner quadrant of left female breast: Secondary | ICD-10-CM | POA: Diagnosis not present

## 2017-07-19 ENCOUNTER — Ambulatory Visit
Admission: RE | Admit: 2017-07-19 | Discharge: 2017-07-19 | Disposition: A | Discharge status: Home / Self Care | Payer: BLUE CROSS/BLUE SHIELD | Source: Ambulatory Visit | Attending: Radiation Oncology | Admitting: Radiation Oncology

## 2017-07-19 DIAGNOSIS — Z510 Encounter for antineoplastic radiation therapy: Secondary | ICD-10-CM | POA: Diagnosis not present

## 2017-07-19 DIAGNOSIS — C50312 Malignant neoplasm of lower-inner quadrant of left female breast: Secondary | ICD-10-CM | POA: Diagnosis not present

## 2017-07-19 DIAGNOSIS — Z170 Estrogen receptor positive status [ER+]: Secondary | ICD-10-CM | POA: Diagnosis not present

## 2017-07-20 ENCOUNTER — Ambulatory Visit
Admission: RE | Admit: 2017-07-20 | Discharge: 2017-07-20 | Disposition: A | Discharge status: Home / Self Care | Payer: BLUE CROSS/BLUE SHIELD | Source: Ambulatory Visit | Attending: Radiation Oncology | Admitting: Radiation Oncology

## 2017-07-20 DIAGNOSIS — C50312 Malignant neoplasm of lower-inner quadrant of left female breast: Secondary | ICD-10-CM | POA: Diagnosis not present

## 2017-07-20 DIAGNOSIS — Z510 Encounter for antineoplastic radiation therapy: Secondary | ICD-10-CM | POA: Diagnosis not present

## 2017-07-20 DIAGNOSIS — Z170 Estrogen receptor positive status [ER+]: Secondary | ICD-10-CM | POA: Diagnosis not present

## 2017-07-21 ENCOUNTER — Encounter: Payer: Self-pay | Admitting: Radiation Oncology

## 2017-07-21 ENCOUNTER — Ambulatory Visit
Admission: RE | Admit: 2017-07-21 | Discharge: 2017-07-21 | Disposition: A | Discharge status: Home / Self Care | Payer: BLUE CROSS/BLUE SHIELD | Source: Ambulatory Visit | Attending: Radiation Oncology | Admitting: Radiation Oncology

## 2017-07-21 DIAGNOSIS — Z510 Encounter for antineoplastic radiation therapy: Secondary | ICD-10-CM | POA: Diagnosis not present

## 2017-07-21 DIAGNOSIS — Z170 Estrogen receptor positive status [ER+]: Secondary | ICD-10-CM | POA: Diagnosis not present

## 2017-07-21 DIAGNOSIS — C50312 Malignant neoplasm of lower-inner quadrant of left female breast: Secondary | ICD-10-CM | POA: Diagnosis not present

## 2017-07-21 NOTE — Progress Notes (Signed)
[facility]  [phone] [phone]     ID: Meredith Donaldson DOB: 1956-01-15  MR#: [id number]  PYK#:[id number]  Patient Care Team: Scherrie Bateman as PCP - General (Family Medicine) Lyndzee Kliebert, Virgie Dad, MD as Consulting Physician (Oncology) Fanny Skates, MD as Consulting Physician (General Surgery) Kyung Rudd, MD as Consulting Physician (Radiation Oncology) OTHER MD:   CHIEF COMPLAINT: Estrogen receptor positive breast cancer  CURRENT TREATMENT: Anastrozole   HISTORY OF CURRENT ILLNESS: From the original intake note:  Meredith Donaldson had routine screening mammography on 03/03/2017 showing a possible abnormality in the left breast. She underwent unilateral left diagnostic mammography with tomography and left breast ultrasonography at [facility] on 03/14/2017 showing: breast density category B. There are were multiple hypoechoic masses of varying sizes in the lower inner quadrant of the left breast found sonographically. A dominant mass at the 7:30 position measuring 0.9 x 0.9 x 0.8 cm near the inframammary fold. Another mass at the 7:30 slightly deep and lateral measuring 0.4 x 0.3 cm. At 8 o'clock position, near the medial margin of the breast is a second dominant mass measuring 0.8 x 0.8 x 0.9 cm. Slightly medial to this is a similar-appearing hypoechoic mass measuring 0.4 x 0.4 x 0.5 cm. Immediately lateral to the dominant mass at 8:00 position, located 12 cm from the nipple, is a 0.4 x 0.4 x 0.5 cm mass. Slightly more superficially at 8 o'clock position 12 cm from the nipple is an irregular hypoechoic mass measuring 0.7 cm maximum diameter. Ultrasound of the left axilla was negative for lymphadenopathy.   Accordingly on 03/28/2017 she proceeded to biopsy of the left breast area in question. The pathology from this procedure showed ([id number]): At the 8 o'clock position, invasive ductal carcinoma with papillary features.  Prognostic indicators significant for: estrogen receptor, 100% positive and progesterone receptor, 100% positive, both with strong staining intensity. Proliferation marker Ki67 at 2%. HER2 not amplified with ratios HER2/CEP17 signals 1.13 and average HER2 copies per cell 1.80  On 04/10/2017 the patient had bilateral breast MRI, showing an area up to 5.4 cm including 2 separate masses and an area of non-masslike enhancement.  The mass that was previously biopsied is the more posterior one, near the inframammary fold.  The more anterior mass has not been biopsied and if lumpectomy is considered biopsying that mass would allow the entire span of abnormal enhancement to be included in the lumpectomy specimen.  This second MRI guided biopsy has been scheduled for 04/17/2017.  The right breast was unremarkable and there were no abnormal looking lymph nodes  The patient's subsequent history is as detailed below.  INTERVAL HISTORY: Meredith Donaldson returns today for follow up and treatment of her estrogen receptor positive breast cancer. Since her last visit, she underwent left lumpectomy and sentinel lymph node sampling ([id number]) on 05/10/2017 with pathology showing: multifocal invasive ductal carcinoma grade 1 with extracapsular extension, spanning 2.5 cm. Low grade DCIS. Negative Margins. 3 left axillary sentinel lymph nodes were negative for carcinoma.    She also completed radiation treatments on 07/21/2017. She notes that the skin is red and a little sore. Her energy was fairly consistent throughout radiation. She denies skin pealing or discoloration.    REVIEW OF SYSTEMS: Meredith Donaldson reports that she still works in the yard. She has some occasional sinus headaches, which she may take a tylenol. She denies unusual headaches, visual changes, nausea, vomiting, or dizziness. There has been no unusual cough, phlegm production, or  pleurisy. This been no change in bowel or bladder habits. She denies unexplained  fatigue or unexplained weight loss, bleeding, rash, or fever. A detailed review of systems was otherwise stable.    PAST MEDICAL HISTORY: Past Medical History:  Diagnosis Date  . Cataracts, bilateral   . Complication of anesthesia   . Hyperlipidemia   . Hypertension   . PONV (postoperative nausea and vomiting)     PAST SURGICAL HISTORY: Past Surgical History:  Procedure Laterality Date  . BREAST BIOPSY Left 03/28/2017  . BREAST BIOPSY Left 04/13/2017  . BREAST LUMPECTOMY WITH RADIOACTIVE SEED AND SENTINEL LYMPH NODE BIOPSY Left 05/10/2017   Procedure: BREAST LUMPECTOMY WITH RADIOACTIVE SEED X2 AND SENTINEL LYMPH NODE BIOPSY;  Surgeon: Fanny Skates, MD;  Location: [facility];  Service: General;  Laterality: Left;  . CESAREAN SECTION    . FRACTURE SURGERY Right 2008   right leg fracture s/p pinning    FAMILY HISTORY Family History  Problem Relation Age of Onset  . Heart attack Father   . Cancer Neg Hx     The patient's father died at age 65 due to several heart attacks and stokes. The patient's mother passed at age 31 due to old age. The patient has 4 brothers and 2 sisters. She denies a family history of breast or ovarian cancer.   GYNECOLOGIC HISTORY:  No LMP recorded. Patient is postmenopausal. Menarche: 62 years old Age at first live birth: 62 years old The patient is GX P2 LMP: at age 32 She took low dose oral contraceptive pills briefly after her 1st birth, with no complications. She had bilateral tubal ligation after her 2nd birth due to pregnancy complications. She retained her ovaries. She did not take HRT.    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 Her husband passed away years ago due to cancer (lymphoma). She lives with her significant other of 7 years, [name], who is retired from being a Furniture conservator/restorer. [name] also helps with farm work. The patient's oldest son, [name] "Corene Cornea" Karn is a Company secretary  At [employer]. The patient's second son, "Christia Reading" Shatina Streets, was a Engineer, building services but is now a Librarian, academic and lives near the patient's farm. The patient has 7 grandchildren.  She has to chew hours and at 120 pound [employer], as well as multiple chickens and 2 peacocks. She does not attend church.      ADVANCED DIRECTIVES: To be addressed   HEALTH MAINTENANCE: Social History   Tobacco Use  . Smoking status: Never Smoker  . Smokeless tobacco: Never Used  Substance Use Topics  . Alcohol use: No  . Drug use: No     Colonoscopy: never  PAP: [place]/ not UTD  Bone density: never     Allergies  Allergen Reactions  . Penicillins Rash and Other (See Comments)    Has patient had a PCN reaction causing immediate rash, facial/tongue/throat swelling, SOB or lightheadedness with hypotension: Unknown Has patient had a PCN reaction causing severe rash involving mucus membranes or skin necrosis: Unknown Has patient had a PCN reaction that required hospitalization: Unknown Has patient had a PCN reaction occurring within the last 10 years: No If all of the above answers are "NO", then may proceed with Cephalosporin use.   . Sulfa Antibiotics Rash    Current Outpatient Medications  Medication Sig Dispense Refill  . acetaminophen (TYLENOL) 500 MG tablet Take 1,000 mg by mouth  every 6 (six) hours as needed for moderate pain or headache.     . bisoprolol-hydrochlorothiazide (ZIAC) 10-6.25 MG tablet Take 1 tablet by mouth daily.  1  . Omega-3 Fatty Acids (FISH OIL) 1200 MG CAPS Take 2,400 mg by mouth daily before lunch.     . vitamin C (ASCORBIC ACID) 500 MG tablet Take 500 mg by mouth daily before lunch.     . vitamin E 400 UNIT capsule Take 400 Units by mouth daily before lunch.     No current facility-administered medications for this visit.     OBJECTIVE: Middle-aged white woman who appears well  Vitals:   07/24/17 0956  BP: (!) 152/66  Pulse: 60  Resp: 18  Temp:  98.2 F (36.8 C)  SpO2: 100%     Body mass index is 37.78 kg/m.   Wt Readings from Last 3 Encounters:  07/24/17 248 lb 8 oz (112.7 kg)  06/14/17 243 lb 12.8 oz (110.6 kg)  05/10/17 241 lb (109.3 kg)      ECOG FS:0 - Asymptomatic  Sclerae unicteric, EOMs intact Oropharynx clear and moist No cervical or supraclavicular adenopathy Lungs no rales or rhonchi Heart regular rate and rhythm Abd soft, nontender, positive bowel sounds MSK no focal spinal tenderness, no upper extremity lymphedema Neuro: nonfocal, well oriented, appropriate affect Breasts: The right breast is benign.  The left breast is status post recent lumpectomy and radiation.  The skin has healed very nicely.  There is a large seroma measuring approximately 5 cm medially.  This is nontender and not erythematous.  Both axillae are benign.    LAB RESULTS:  CMP     Component Value Date/Time   NA 141 05/03/2017 1241   K 3.8 05/03/2017 1241   CL 107 05/03/2017 1241   CO2 24 05/03/2017 1241   GLUCOSE 103 (H) 05/03/2017 1241   BUN 14 05/03/2017 1241   CREATININE 0.91 05/03/2017 1241   CREATININE 0.90 04/13/2017 1449   CALCIUM 9.2 05/03/2017 1241   PROT 6.8 05/03/2017 1241   ALBUMIN 3.9 05/03/2017 1241   AST 19 05/03/2017 1241   AST 16 04/13/2017 1449   ALT 17 05/03/2017 1241   ALT 11 04/13/2017 1449   ALKPHOS 49 05/03/2017 1241   BILITOT 1.7 (H) 05/03/2017 1241   BILITOT 2.1 (H) 04/13/2017 1449   GFRNONAA >60 05/03/2017 1241   GFRNONAA >60 04/13/2017 1449   GFRAA >60 05/03/2017 1241   GFRAA >60 04/13/2017 1449    No results found for: TOTALPROTELP, ALBUMINELP, A1GS, A2GS, BETS, BETA2SER, GAMS, MSPIKE, SPEI  No results found for: KPAFRELGTCHN, LAMBDASER, KAPLAMBRATIO  Lab Results  Component Value Date   WBC 8.5 05/03/2017   NEUTROABS 4.3 05/03/2017   HGB 12.6 05/03/2017   HCT 38.6 05/03/2017   MCV 97.2 05/03/2017   PLT 215 05/03/2017    _0 @  No results found for: LABCA2  No  components found for: QPRFFM384  No results for input(s): INR in the last 168 hours.  No results found for: LABCA2  No results found for: YKZ993  No results found for: TTS177  No results found for: LTJ030  No results found for: CA2729  No components found for: HGQUANT  No results found for: CEA1 / No results found for: CEA1   No results found for: AFPTUMOR  No results found for: CHROMOGRNA  No results found for: PSA1  No visits with results within 3 Day(s) from this visit.  Latest known visit with results is:  Hospital Outpatient  Visit on 05/03/2017  Component Date Value Ref Range Status  . WBC 05/03/2017 8.5  4.0 - 10.5 K/uL Final  . RBC 05/03/2017 3.97  3.87 - 5.11 MIL/uL Final  . Hemoglobin 05/03/2017 12.6  12.0 - 15.0 g/dL Final  . HCT 05/03/2017 38.6  36.0 - 46.0 % Final  . MCV 05/03/2017 97.2  78.0 - 100.0 fL Final  . MCH 05/03/2017 31.7  26.0 - 34.0 pg Final  . MCHC 05/03/2017 32.6  30.0 - 36.0 g/dL Final  . RDW 05/03/2017 13.1  11.5 - 15.5 % Final  . Platelets 05/03/2017 215  150 - 400 K/uL Final  . Neutrophils Relative % 05/03/2017 50  % Final  . Neutro Abs 05/03/2017 4.3  1.7 - 7.7 K/uL Final  . Lymphocytes Relative 05/03/2017 40  % Final  . Lymphs Abs 05/03/2017 3.4  0.7 - 4.0 K/uL Final  . Monocytes Relative 05/03/2017 7  % Final  . Monocytes Absolute 05/03/2017 0.6  0.1 - 1.0 K/uL Final  . Eosinophils Relative 05/03/2017 2  % Final  . Eosinophils Absolute 05/03/2017 0.1  0.0 - 0.7 K/uL Final  . Basophils Relative 05/03/2017 1  % Final  . Basophils Absolute 05/03/2017 0.1  0.0 - 0.1 K/uL Final   Performed at [facility], [place] [address] [place], [place] [address]  . Sodium 05/03/2017 141  135 - 145 mmol/L Final  . Potassium 05/03/2017 3.8  3.5 - 5.1 mmol/L Final  . Chloride 05/03/2017 107  101 - 111 mmol/L Final  . CO2 05/03/2017 24  22 - 32 mmol/L Final  . Glucose, Bld 05/03/2017 103* 65 - 99 mg/dL Final  . BUN 05/03/2017 14  6 - 20 mg/dL Final   . Creatinine, Ser 05/03/2017 0.91  0.44 - 1.00 mg/dL Final  . Calcium 05/03/2017 9.2  8.9 - 10.3 mg/dL Final  . Total Protein 05/03/2017 6.8  6.5 - 8.1 g/dL Final  . Albumin 05/03/2017 3.9  3.5 - 5.0 g/dL Final  . AST 05/03/2017 19  15 - 41 U/L Final  . ALT 05/03/2017 17  14 - 54 U/L Final  . Alkaline Phosphatase 05/03/2017 49  38 - 126 U/L Final  . Total Bilirubin 05/03/2017 1.7* 0.3 - 1.2 mg/dL Final  . GFR calc non Af Amer 05/03/2017 >60  >60 mL/min Final  . GFR calc Af Amer 05/03/2017 >60  >60 mL/min Final   Comment: (NOTE) The eGFR has been calculated using the CKD EPI equation. This calculation has not been validated in all clinical situations. eGFR's persistently <60 mL/min signify possible Chronic Kidney Disease.   . Anion gap 05/03/2017 10  5 - 15 Final   Performed at [facility], [place] [address], [place], [place] [address]    (this displays the last labs from the last 3 days)  No results found for: TOTALPROTELP, ALBUMINELP, A1GS, A2GS, BETS, BETA2SER, GAMS, MSPIKE, SPEI (this displays SPEP labs)  No results found for: KPAFRELGTCHN, LAMBDASER, KAPLAMBRATIO (kappa/lambda light chains)  No results found for: HGBA, HGBA2QUANT, HGBFQUANT, HGBSQUAN (Hemoglobinopathy evaluation)   No results found for: LDH  No results found for: IRON, TIBC, IRONPCTSAT (Iron and TIBC)  No results found for: FERRITIN  Urinalysis    Component Value Date/Time   COLORURINE YELLOW 01/09/2016 [place] 01/09/2016 1011   LABSPEC 1.008 01/09/2016 1011   PHURINE 5.0 01/09/2016 1011   GLUCOSEU NEGATIVE 01/09/2016 [place] 01/09/2016 [place] 01/09/2016 1011  KETONESUR NEGATIVE 01/09/2016 [place] 01/09/2016 1011   NITRITE NEGATIVE 01/09/2016 [place] 01/09/2016 1011     STUDIES: No results found.  ELIGIBLE FOR AVAILABLE RESEARCH PROTOCOL: no  ASSESSMENT: 62 y.o.  [place] woman status  post left breast lower inner quadrant biopsy 03/28/2017 for a clinical mT1b N0, stage I invasive ductal carcinoma, grade 2, estrogen and progesterone receptor strongly positive, with no HER-2 amplification, and an MIB-1 of 2%.  (1) status post left lumpectomy and sentinel lymph node sampling 05/10/2017 for an mpT2 pN0, stage IB invasive ductal carcinoma, grade 1, with negative margins.  (a) a total of 3 sentinel lymph nodes were removed  (2) The Oncotype DX score was 14, predicting a risk of outside the breast recurrence over the next 10 years of 4% if the patient's only systemic therapy is tamoxifen for 5 years.  It also predicts no significant benefit from chemotherapy.  (3) adjuvant radiation completed 07/21/2017  (4) anastrozole started 07/24/2017    PLAN: Meredith Donaldson has completed her local treatment and is now ready to start anti-estrogens.  We discussed the difference between tamoxifen and anastrozole in detail. She understands that anastrozole and the aromatase inhibitors in general work by blocking estrogen production. Accordingly vaginal dryness, decrease in bone density, and of course hot flashes can result. The aromatase inhibitors can also negatively affect the cholesterol profile, although that is a minor effect. One out of 5 women on aromatase inhibitors we will feel "old and achy". This arthralgia/myalgia syndrome, which resembles fibromyalgia clinically, does resolve with stopping the medications. Accordingly this is not a reason to not try an aromatase inhibitor but it is a frequent reason to stop it (in other words 20% of women will not be able to tolerate these medications).  Tamoxifen on the other hand does not block estrogen production. It does not "take away a woman's estrogen". It blocks the estrogen receptor in breast cells. Like anastrozole, it can also cause hot flashes. As opposed to anastrozole, tamoxifen has many estrogen-like effects. It is technically an estrogen  receptor modulator. This means that in some tissues tamoxifen works like estrogen-- for example it helps strengthen the bones. It tends to improve the cholesterol profile. It can cause thickening of the endometrial lining, and even endometrial polyps or rarely cancer of the uterus.(The risk of uterine cancer due to tamoxifen is one additional cancer per thousand women year). It can cause vaginal wetness or stickiness. It can cause blood clots through this estrogen-like effect--the risk of blood clots with tamoxifen is exactly the same as with birth control pills or hormone replacement.  Neither of these agents causes mood changes or weight gain, despite the popular belief that they can have these side effects. We have data from studies comparing either of these drugs with placebo, and in those cases the control group had the same amount of weight gain and depression as the group that took the drug.  I think she would be a good candidate for anastrozole and I have placed the prescription in for her.  She will let us know she has any side effects from that.   It would be helpful to have a baseline bone density which I have ordered for September.  She will be seeing Dr. Dalbert Batman around that time and hopefully she will be able to get both appointments done at the same day  She is going to return to see me the first week in December.  She  is tolerating anastrozole well the plan will be to broaden the follow-up interval from there.   Dariel Betzer, Virgie Dad, MD  07/24/17 10:05 AM Medical Oncology and Hematology [facility] [address] [place], [place] [address] Tel. [phone]    Fax. [phone]  Alice Rieger, am acting as scribe for Chauncey Cruel MD.  I, Lurline Del MD, have reviewed the above documentation for accuracy and completeness, and I agree with the above.

## 2017-07-24 ENCOUNTER — Ambulatory Visit: Payer: BLUE CROSS/BLUE SHIELD

## 2017-07-24 ENCOUNTER — Telehealth: Payer: Self-pay | Admitting: Oncology

## 2017-07-24 ENCOUNTER — Inpatient Hospital Stay: Payer: BLUE CROSS/BLUE SHIELD | Attending: Oncology | Admitting: Oncology

## 2017-07-24 VITALS — BP 152/66 | HR 60 | Temp 98.2°F | Resp 18 | Ht 68.0 in | Wt 248.5 lb

## 2017-07-24 DIAGNOSIS — Z170 Estrogen receptor positive status [ER+]: Secondary | ICD-10-CM

## 2017-07-24 DIAGNOSIS — C50312 Malignant neoplasm of lower-inner quadrant of left female breast: Secondary | ICD-10-CM | POA: Insufficient documentation

## 2017-07-24 MED ORDER — ANASTROZOLE 1 MG PO TABS: 1.0000 mg | ORAL_TABLET | Freq: Every day | ORAL | 4 refills | Status: DC

## 2017-07-24 NOTE — Telephone Encounter (Signed)
Gave patient AVS and Calendars for Sept and Dec.  Called to schedule DEXA for September.

## 2017-07-25 ENCOUNTER — Ambulatory Visit: Payer: BLUE CROSS/BLUE SHIELD

## 2017-07-26 ENCOUNTER — Ambulatory Visit: Payer: BLUE CROSS/BLUE SHIELD

## 2017-07-28 ENCOUNTER — Ambulatory Visit: Payer: BLUE CROSS/BLUE SHIELD

## 2017-07-29 ENCOUNTER — Emergency Department (HOSPITAL_COMMUNITY): Payer: BLUE CROSS/BLUE SHIELD

## 2017-07-29 ENCOUNTER — Emergency Department (HOSPITAL_COMMUNITY)
Admission: EM | Admit: 2017-07-29 | Discharge: 2017-07-29 | Disposition: A | Discharge status: Home / Self Care | Payer: BLUE CROSS/BLUE SHIELD | Attending: Emergency Medicine | Admitting: Emergency Medicine

## 2017-07-29 ENCOUNTER — Encounter (HOSPITAL_COMMUNITY): Payer: Self-pay

## 2017-07-29 DIAGNOSIS — R1030 Lower abdominal pain, unspecified: Secondary | ICD-10-CM | POA: Diagnosis not present

## 2017-07-29 DIAGNOSIS — Z79899 Other long term (current) drug therapy: Secondary | ICD-10-CM | POA: Diagnosis not present

## 2017-07-29 DIAGNOSIS — R109 Unspecified abdominal pain: Secondary | ICD-10-CM | POA: Diagnosis not present

## 2017-07-29 DIAGNOSIS — I10 Essential (primary) hypertension: Secondary | ICD-10-CM | POA: Insufficient documentation

## 2017-07-29 HISTORY — DX: Malignant (primary) neoplasm, unspecified: C80.1

## 2017-07-29 LAB — COMPREHENSIVE METABOLIC PANEL
ALT: 17 U/L (ref 0–44)
ANION GAP: 8 (ref 5–15)
AST: 17 U/L (ref 15–41)
Albumin: 3.5 g/dL (ref 3.5–5.0)
Alkaline Phosphatase: 59 U/L (ref 38–126)
BUN: 11 mg/dL (ref 8–23)
CHLORIDE: 105 mmol/L (ref 98–111)
CO2: 25 mmol/L (ref 22–32)
Calcium: 8.9 mg/dL (ref 8.9–10.3)
Creatinine, Ser: 0.85 mg/dL (ref 0.44–1.00)
Glucose, Bld: 112 mg/dL — ABNORMAL HIGH (ref 70–99)
POTASSIUM: 4 mmol/L (ref 3.5–5.1)
Sodium: 138 mmol/L (ref 135–145)
Total Bilirubin: 2.6 mg/dL — ABNORMAL HIGH (ref 0.3–1.2)
Total Protein: 6.7 g/dL (ref 6.5–8.1)

## 2017-07-29 LAB — URINALYSIS, ROUTINE W REFLEX MICROSCOPIC
Bilirubin Urine: NEGATIVE
GLUCOSE, UA: NEGATIVE mg/dL
HGB URINE DIPSTICK: NEGATIVE
Ketones, ur: NEGATIVE mg/dL
Leukocytes, UA: NEGATIVE
NITRITE: NEGATIVE
PH: 5 (ref 5.0–8.0)
Protein, ur: NEGATIVE mg/dL
SPECIFIC GRAVITY, URINE: 1.01 (ref 1.005–1.030)

## 2017-07-29 LAB — CBC WITH DIFFERENTIAL/PLATELET
Abs Immature Granulocytes: 0 10*3/uL (ref 0.0–0.1)
BASOS ABS: 0.1 10*3/uL (ref 0.0–0.1)
Basophils Relative: 1 %
EOS PCT: 1 %
Eosinophils Absolute: 0.1 10*3/uL (ref 0.0–0.7)
HCT: 36.3 % (ref 36.0–46.0)
HEMOGLOBIN: 11.7 g/dL — AB (ref 12.0–15.0)
Immature Granulocytes: 1 %
Lymphocytes Relative: 15 %
Lymphs Abs: 1.2 10*3/uL (ref 0.7–4.0)
MCH: 32.2 pg (ref 26.0–34.0)
MCHC: 32.2 g/dL (ref 30.0–36.0)
MCV: 100 fL (ref 78.0–100.0)
MONO ABS: 0.7 10*3/uL (ref 0.1–1.0)
Monocytes Relative: 9 %
Neutro Abs: 6.3 10*3/uL (ref 1.7–7.7)
Neutrophils Relative %: 73 %
PLATELETS: 186 10*3/uL (ref 150–400)
RBC: 3.63 MIL/uL — ABNORMAL LOW (ref 3.87–5.11)
RDW: 13.4 % (ref 11.5–15.5)
WBC: 8.4 10*3/uL (ref 4.0–10.5)

## 2017-07-29 MED ORDER — IOHEXOL 300 MG/ML  SOLN
100.0000 mL | Freq: Once | INTRAMUSCULAR | Status: AC | PRN
  Administered 2017-07-29: 100 mL via INTRAVENOUS

## 2017-07-29 NOTE — Discharge Instructions (Signed)
Please read and follow all provided instructions.  Your diagnoses today include:  1. Lower abdominal pain     Tests performed today include:  Blood counts and electrolytes  Blood tests to check liver and kidney function  Urine test to look for infection  CT abdomen and pelvis - shows some lower spine arthritis and a small kidney cyst but no infections or other problems  Vital signs. See below for your results today.   Medications prescribed:   None  Take any prescribed medications only as directed.  Home care instructions:   Follow any educational materials contained in this packet.  Follow-up instructions: Please follow-up with your primary care provider in the next 3 days for further evaluation of your symptoms.    Return instructions:  SEEK IMMEDIATE MEDICAL ATTENTION IF:  The pain does not go away or becomes severe   A temperature above 101F develops   Repeated vomiting occurs (multiple episodes)   The pain becomes localized to portions of the abdomen. The right side could possibly be appendicitis. In an adult, the left lower portion of the abdomen could be colitis or diverticulitis.   Blood is being passed in stools or vomit (bright red or black tarry stools)   You develop chest pain, difficulty breathing, dizziness or fainting, or become confused, poorly responsive, or inconsolable (young children)  If you have any other emergent concerns regarding your health  Additional Information: Abdominal (belly) pain can be caused by many things. Your caregiver performed an examination and possibly ordered blood/urine tests and imaging (CT scan, x-rays, ultrasound). Many cases can be observed and treated at home after initial evaluation in the emergency department. Even though you are being discharged home, abdominal pain can be unpredictable. Therefore, you need a repeated exam if your pain does not resolve, returns, or worsens. Most patients with abdominal pain don't  have to be admitted to the hospital or have surgery, but serious problems like appendicitis and gallbladder attacks can start out as nonspecific pain. Many abdominal conditions cannot be diagnosed in one visit, so follow-up evaluations are very important.  Your vital signs today were: BP 132/67 (BP Location: Left Arm)    Pulse 62    Temp 98.7 F (37.1 C) (Oral)    Resp 16    SpO2 99%  If your blood pressure (bp) was elevated above 135/85 this visit, please have this repeated by your doctor within one month. --------------

## 2017-07-29 NOTE — ED Notes (Signed)
Called CT to let them know that IV has been placed and pt is ready for transport

## 2017-07-29 NOTE — ED Notes (Signed)
Patient transported to CT 

## 2017-07-29 NOTE — ED Provider Notes (Signed)
[facility] EMERGENCY DEPARTMENT Provider Note   CSN: [id number] Arrival date & time: 07/29/17  1103     History   Chief Complaint No chief complaint on file.   HPI Meredith Donaldson is a 62 y.o. female.  Patient with history of breast cancer, recently completed radiation therapy, no abdominal surgeries other than C-section approximately 40 years ago presents to the emergency department with 2-day history of lower abdominal pain.  Pain is much improved when she is lying flat but when she stands up she has severe pain in her lower abdomen.  She has had no associated fevers, nausea, vomiting, or diarrhea.  No vaginal bleeding or discharge.  She denies any increased frequency, dysuria, increased urgency to me.  No treatments prior to arrival. The onset of this condition was acute. The course is constant. Aggravating factors: standing and walking. Alleviating factors: lying down.       Past Medical History:  Diagnosis Date  . Cataracts, bilateral   . Complication of anesthesia   . Hyperlipidemia   . Hypertension   . PONV (postoperative nausea and vomiting)     Patient Active Problem List   Diagnosis Date Noted  . Malignant neoplasm of lower-inner quadrant of left breast in female, estrogen receptor positive ([place]) 04/12/2017    Past Surgical History:  Procedure Laterality Date  . BREAST BIOPSY Left 03/28/2017  . BREAST BIOPSY Left 04/13/2017  . BREAST LUMPECTOMY WITH RADIOACTIVE SEED AND SENTINEL LYMPH NODE BIOPSY Left 05/10/2017   Procedure: BREAST LUMPECTOMY WITH RADIOACTIVE SEED X2 AND SENTINEL LYMPH NODE BIOPSY;  Surgeon: Fanny Skates, MD;  Location: [facility];  Service: General;  Laterality: Left;  . CESAREAN SECTION    . FRACTURE SURGERY Right 2008   right leg fracture s/p pinning     OB History   None      Home Medications    Prior to Admission medications   Medication Sig Start Date End Date Taking? Authorizing Provider  acetaminophen  (TYLENOL) 500 MG tablet Take 1,000 mg by mouth every 6 (six) hours as needed for moderate pain or headache.    Yes [provider]  anastrozole (ARIMIDEX) 1 MG tablet Take 1 tablet (1 mg total) by mouth daily. 07/24/17  Yes Magrinat, Virgie Dad, MD  bisoprolol-hydrochlorothiazide [facility]) 10-6.25 MG tablet Take 1 tablet by mouth daily. 12/08/15  Yes [provider]  Omega-3 Fatty Acids (FISH OIL) 1200 MG CAPS Take 2,400 mg by mouth daily before lunch.    Yes [provider]  vitamin C (ASCORBIC ACID) 500 MG tablet Take 500 mg by mouth daily before lunch.    Yes [provider]  vitamin E 400 UNIT capsule Take 400 Units by mouth daily before lunch.   Yes [provider]    Family History Family History  Problem Relation Age of Onset  . Heart attack Father   . Cancer Neg Hx     Social History Social History   Tobacco Use  . Smoking status: Never Smoker  . Smokeless tobacco: Never Used  Substance Use Topics  . Alcohol use: No  . Drug use: No     Allergies   Penicillins and Sulfa antibiotics   Review of Systems Review of Systems  Constitutional: Negative for fever.  HENT: Negative for rhinorrhea and sore throat.   Eyes: Negative for redness.  Respiratory: Negative for cough.   Cardiovascular: Negative for chest pain.  Gastrointestinal: Positive for abdominal pain. Negative for diarrhea, nausea and  vomiting.  Genitourinary: Negative for dysuria, frequency, urgency, vaginal bleeding and vaginal discharge.  Musculoskeletal: Negative for myalgias.  Skin: Negative for rash.  Neurological: Negative for headaches.     Physical Exam Updated Vital Signs BP 127/73 (BP Location: Right Arm)   Pulse (!) 56   Temp 98.7 F (37.1 C) (Oral)   Resp 16   SpO2 98%   Physical Exam  Constitutional: She appears well-developed and well-nourished.  HENT:  Head: Normocephalic and atraumatic.  Eyes: Conjunctivae are normal. Right eye exhibits no  discharge. Left eye exhibits no discharge.  Neck: Normal range of motion. Neck supple.  Cardiovascular: Normal rate, regular rhythm and normal heart sounds.  Pulmonary/Chest: Effort normal and breath sounds normal.  Abdominal: Soft. There is no tenderness. There is no rebound and no guarding.  With standing, patient winces with pain and holds her suprapubic area.  Area is somewhat tender when she is standing.  When lying flat, no significant tenderness.  Neurological: She is alert.  Skin: Skin is warm and dry.  Psychiatric: She has a normal mood and affect.  Nursing note and vitals reviewed.    ED Treatments / Results  Labs (all labs ordered are listed, but only abnormal results are displayed) Labs Reviewed  CBC WITH DIFFERENTIAL/PLATELET - Abnormal; Notable for the following components:      Result Value   RBC 3.63 (*)    Hemoglobin 11.7 (*)    All other components within normal limits  COMPREHENSIVE METABOLIC PANEL - Abnormal; Notable for the following components:   Glucose, Bld 112 (*)    Total Bilirubin 2.6 (*)    All other components within normal limits  URINALYSIS, ROUTINE W REFLEX MICROSCOPIC    EKG None  Radiology No results found.  Procedures Procedures (including critical care time)  Medications Ordered in ED Medications - No data to display   Initial Impression / Assessment and Plan / ED Course  I have reviewed the triage vital signs and the nursing notes.  Pertinent labs & imaging results that were available during my care of the patient were reviewed by me and considered in my medical decision making (see chart for details).     Patient seen and examined. Work-up initiated. Medications ordered.   Vital signs reviewed and are as follows: BP 127/73 (BP Location: Right Arm)   Pulse (!) 56   Temp 98.7 F (37.1 C) (Oral)   Resp 16   SpO2 98%   Reviewed lab work with patient.  White blood cell count is reassuring and urine does not demonstrate any  signs of infection.  We discussed watchful waiting versus further evaluation with CT imaging.  Discussed that CT imaging may not give [place] an answer today but would rule out more dangerous or serious conditions.  Patient and husband discussed this and agreed to proceed with imaging.  4:25 PM CT was negative for acute findings.  Patient informed of renal cyst and of lumbar arthritis.  Patient will use a Tylenol at home for pain.  Encourage PCP follow-up in the next 3 days for recheck.  The patient was urged to return to the Emergency Department immediately with worsening of current symptoms, worsening abdominal pain, persistent vomiting, blood noted in stools, fever, or any other concerns. The patient verbalized understanding.   Final Clinical Impressions(s) / ED Diagnoses   Final diagnoses:  Lower abdominal pain   Patient with abdominal pain. Vitals are stable, no fever. Labs reassuring. Imaging without acute findings. No signs of  dehydration, patient is tolerating PO's. Lungs are clear and no signs suggestive of PNA. Low concern for appendicitis, cholecystitis, pancreatitis, ruptured viscus, UTI, kidney stone, aortic dissection, aortic aneurysm or other emergent abdominal etiology. Supportive therapy indicated with return if symptoms worsen.    ED Discharge Orders    None       Carlisle Cater, Hershal Coria 07/29/17 1625    Valarie Merino, MD 07/30/17 [phone]

## 2017-07-29 NOTE — ED Triage Notes (Signed)
Pt presents with 2 day h/o suprapubic pain.  Pt reports when she is lying or sitting down there is no pain but reports when she gets up and starts walking the pain worsens.  Pt denies any nausea; reports some constipation and dysuria.

## 2017-07-29 NOTE — ED Notes (Signed)
Pt returned from CT °

## 2017-07-31 ENCOUNTER — Telehealth: Payer: Self-pay | Admitting: *Deleted

## 2017-07-31 ENCOUNTER — Ambulatory Visit: Payer: BLUE CROSS/BLUE SHIELD

## 2017-07-31 NOTE — Telephone Encounter (Signed)
This RN received communication from [place] per pt's call on 07/29/2017 with noted lower abd pain occurring since starting anastrazole.  Pt was advised to proceed to the ER.  This RN called pt and obtained identified VM- message left requesting a return call for update on her status per above communication.

## 2017-08-02 NOTE — Progress Notes (Signed)
  Radiation Oncology         [phone]) [phone] ________________________________  Name: Meredith Donaldson MRN: [id number]  Date: 07/21/2017  DOB: Feb 13, 1955  End of Treatment Note  Diagnosis:   62 y.o. female with Stage IA, pT2N0M0 grade 2 ER/PR positive, invasive ductal carcinoma of the left breast   Indication for treatment:  Curative       Radiation treatment dates:   06/26/2017 - 07/21/2017  Site/dose:    1. Left Breast / 42.56 Gy in 16 fractions 2. Left Breast Boost / 8 Gy in 4 fractions  Beams/energy:    1. 3D / 10X, 15X Photon 2. 3D / 6X, 15X Photon  Narrative: The patient tolerated radiation treatment relatively well.  She experienced some expected skin irritation as she progressed through treatment, notably moderate erythema in the treated area but no desquamation. She is applying Radiaplex gel to this area two times per day.  Plan: The patient has completed radiation treatment. The patient will return to radiation oncology clinic for routine followup in one month. I advised them to call or return sooner if they have any questions or concerns related to their recovery or treatment.  ------------------------------------------------  Jodelle Gross, MD, PhD  This document serves as a record of services personally performed by Kyung Rudd, MD. It was created on his behalf by Rae Lips, a trained medical scribe. The creation of this record is based on the scribe's personal observations and the provider's statements to them. This document has been checked and approved by the attending provider.

## 2017-09-04 ENCOUNTER — Encounter: Payer: Self-pay | Admitting: Radiation Oncology

## 2017-09-04 ENCOUNTER — Ambulatory Visit
Admission: RE | Admit: 2017-09-04 | Discharge: 2017-09-04 | Disposition: A | Discharge status: Home / Self Care | Payer: BLUE CROSS/BLUE SHIELD | Source: Ambulatory Visit | Attending: Radiation Oncology | Admitting: Radiation Oncology

## 2017-09-04 VITALS — BP 154/65 | HR 67 | Temp 98.1°F | Resp 20 | Ht 68.0 in | Wt 251.4 lb

## 2017-09-04 DIAGNOSIS — C50312 Malignant neoplasm of lower-inner quadrant of left female breast: Secondary | ICD-10-CM | POA: Diagnosis not present

## 2017-09-04 DIAGNOSIS — Z923 Personal history of irradiation: Secondary | ICD-10-CM | POA: Insufficient documentation

## 2017-09-04 DIAGNOSIS — Z79811 Long term (current) use of aromatase inhibitors: Secondary | ICD-10-CM | POA: Insufficient documentation

## 2017-09-04 DIAGNOSIS — Z79899 Other long term (current) drug therapy: Secondary | ICD-10-CM | POA: Insufficient documentation

## 2017-09-04 DIAGNOSIS — Z170 Estrogen receptor positive status [ER+]: Secondary | ICD-10-CM | POA: Diagnosis not present

## 2017-09-04 NOTE — Progress Notes (Signed)
  Radiation Oncology         [phone] ________________________________  Name: Meredith Donaldson MRN: [id number]  Date of Service: 09/04/2017  DOB: 1955/02/09  Post Treatment Note  CC: Bess Kinds, MD  Diagnosis:   Stage IA, pT2N0M0 grade 2 ER/PR positive, invasive ductal carcinoma of the left breast  Interval Since Last Radiation:  7 weeks   06/26/2017 - 07/21/2017:  1. Left Breast / 42.56 Gy in 16 fractions 2. Left Breast Boost / 8 Gy in 4 fractions  Narrative:  The patient returns today for routine follow-up. During treatment she did very well with radiotherapy and did not have significant desquamation.                             On review of systems, the patient states she is doing well overall. She denies any difficulty with her skin at this time.   ALLERGIES:  is allergic to penicillins and sulfa antibiotics.  Meds: Current Outpatient Medications  Medication Sig Dispense Refill  . anastrozole (ARIMIDEX) 1 MG tablet Take 1 tablet (1 mg total) by mouth daily. 90 tablet 4  . bisoprolol-hydrochlorothiazide (ZIAC) 10-6.25 MG tablet Take 1 tablet by mouth daily.  1  . Omega-3 Fatty Acids (FISH OIL) 1200 MG CAPS Take 2,400 mg by mouth daily before lunch.     . vitamin C (ASCORBIC ACID) 500 MG tablet Take 500 mg by mouth daily before lunch.     . vitamin E 400 UNIT capsule Take 400 Units by mouth daily before lunch.    Marland Kitchen acetaminophen (TYLENOL) 500 MG tablet Take 1,000 mg by mouth every 6 (six) hours as needed for moderate pain or headache.      No current facility-administered medications for this encounter.     Physical Findings:  height is 5\' 8"  (1.727 m) and weight is 251 lb 6.4 oz (114 kg). Her oral temperature is 98.1 F (36.7 C). Her blood pressure is 154/65 (abnormal) and her pulse is 67. Her respiration is 20 and oxygen saturation is 99%.  Pain Assessment Pain Score: 2  Pain Frequency: Occasional Pain Loc: Knee/10 In general this is  a well appearing caucasian female in no acute distress. She's alert and oriented x4 and appropriate throughout the examination. Cardiopulmonary assessment is negative for acute distress and she exhibits normal effort. The left breast was examined and reveals mild hyperpigmentation without desquamation.   Lab Findings: Lab Results  Component Value Date   WBC 8.4 07/29/2017   HGB 11.7 (L) 07/29/2017   HCT 36.3 07/29/2017   MCV 100.0 07/29/2017   PLT 186 07/29/2017     Radiographic Findings: No results found.  Impression/Plan: 1. Stage IA, pT2N0M0 grade 2 ER/PR positive, invasive ductal carcinoma of the left breast. The patient has been doing well since completion of radiotherapy. We discussed that we would be happy to continue to follow her as needed, but she will also continue to follow up with Dr. Jana Hakim in medical oncology. She was counseled on skin care as well as measures to avoid sun exposure to this area.  2. Survivorship. We discussed the importance of survivorship evaluation and she is currently scheduled for this in the near future. She was also given the monthly calendar for access to resources offered within the cancer center.     Carola Rhine, PAC

## 2017-09-05 ENCOUNTER — Encounter: Payer: Self-pay | Admitting: Radiation Oncology

## 2017-09-06 ENCOUNTER — Ambulatory Visit
Admission: RE | Admit: 2017-09-06 | Payer: BLUE CROSS/BLUE SHIELD | Source: Ambulatory Visit | Admitting: Radiation Oncology

## 2017-10-03 ENCOUNTER — Ambulatory Visit
Admission: RE | Admit: 2017-10-03 | Discharge: 2017-10-03 | Disposition: A | Discharge status: Home / Self Care | Payer: BLUE CROSS/BLUE SHIELD | Source: Ambulatory Visit | Attending: Oncology | Admitting: Oncology

## 2017-10-03 DIAGNOSIS — C50312 Malignant neoplasm of lower-inner quadrant of left female breast: Secondary | ICD-10-CM

## 2017-10-03 DIAGNOSIS — Z170 Estrogen receptor positive status [ER+]: Principal | ICD-10-CM

## 2017-10-03 DIAGNOSIS — Z780 Asymptomatic menopausal state: Secondary | ICD-10-CM | POA: Diagnosis not present

## 2017-10-03 DIAGNOSIS — Z13820 Encounter for screening for osteoporosis: Secondary | ICD-10-CM | POA: Diagnosis not present

## 2017-10-23 DIAGNOSIS — Z98891 History of uterine scar from previous surgery: Secondary | ICD-10-CM | POA: Diagnosis not present

## 2017-10-23 DIAGNOSIS — I10 Essential (primary) hypertension: Secondary | ICD-10-CM | POA: Diagnosis not present

## 2017-10-23 DIAGNOSIS — C50312 Malignant neoplasm of lower-inner quadrant of left female breast: Secondary | ICD-10-CM | POA: Diagnosis not present

## 2017-10-23 DIAGNOSIS — Z6836 Body mass index (BMI) 36.0-36.9, adult: Secondary | ICD-10-CM | POA: Diagnosis not present

## 2017-10-25 ENCOUNTER — Telehealth: Payer: Self-pay

## 2017-10-25 NOTE — Telephone Encounter (Signed)
Spoke with pt reminding of SCP visit with NP on 10/30/17 at 10 am. Pt said she will come to appt.

## 2017-10-30 ENCOUNTER — Inpatient Hospital Stay: Payer: BLUE CROSS/BLUE SHIELD | Attending: Oncology | Admitting: Adult Health

## 2017-10-30 ENCOUNTER — Telehealth: Payer: Self-pay | Admitting: Adult Health

## 2017-10-30 ENCOUNTER — Encounter: Payer: Self-pay | Admitting: Adult Health

## 2017-10-30 VITALS — BP 146/69 | HR 66 | Temp 98.5°F | Resp 18 | Ht 68.0 in | Wt 248.8 lb

## 2017-10-30 DIAGNOSIS — C50312 Malignant neoplasm of lower-inner quadrant of left female breast: Secondary | ICD-10-CM | POA: Insufficient documentation

## 2017-10-30 DIAGNOSIS — Z170 Estrogen receptor positive status [ER+]: Secondary | ICD-10-CM | POA: Insufficient documentation

## 2017-10-30 DIAGNOSIS — Z23 Encounter for immunization: Secondary | ICD-10-CM | POA: Diagnosis not present

## 2017-10-30 MED ORDER — INFLUENZA VAC SPLIT QUAD 0.5 ML IM SUSY
0.5000 mL | PREFILLED_SYRINGE | Freq: Once | INTRAMUSCULAR | Status: AC
  Administered 2017-10-30: 0.5 mL via INTRAMUSCULAR

## 2017-10-30 NOTE — Progress Notes (Signed)
CLINIC:  Survivorship   REASON FOR VISIT:  Routine follow-up post-treatment for a recent history of breast cancer.  BRIEF ONCOLOGIC HISTORY:    Malignant neoplasm of lower-inner quadrant of left breast in female, estrogen receptor positive ([place])   03/28/2017 Initial Biopsy    status post left breast lower inner quadrant biopsy for a clinical mT1b N0, stage I invasive ductal carcinoma, grade 2, estrogen and progesterone receptor strongly positive, with no HER-2 amplification, and an MIB-1 of 2%.    04/12/2017 Initial Diagnosis    Malignant neoplasm of lower-inner quadrant of left breast in female, estrogen receptor positive ([place])    05/10/2017 Surgery    status post left lumpectomy and sentinel lymph node sampling 05/10/2017 for an mpT2 pN0, stage IB invasive ductal carcinoma, grade 1, with negative margins.             (a) a total of 3 sentinel lymph nodes were removed    05/10/2017 Oncotype testing    The Oncotype DX score was 14, predicting a risk of outside the breast recurrence over the next 10 years of 4% if the patient's only systemic therapy is tamoxifen for 5 years.  It also predicts no significant benefit from chemotherapy.    05/31/2017 Cancer Staging    Staging form: Breast, AJCC 8th Edition - Pathologic: Stage IA (pT2, pN0, cM0, G2, ER+, PR+, HER2-) - Signed by Gardenia Phlegm, NP on 05/31/2017    06/26/2017 - 07/16/2017 Radiation Therapy    1. Left Breast / 42.56 Gy in 16 fractions 2. Left Breast Boost / 8 Gy in 4 fractions    07/2017 -  Anti-estrogen oral therapy    Anastrozole     INTERVAL HISTORY:  Meredith Donaldson presents to the [facility] today for our initial meeting to review her survivorship care plan detailing her treatment course for breast cancer, as well as monitoring long-term side effects of that treatment, education regarding health maintenance, screening, and overall wellness and health promotion.     Overall, Meredith Donaldson reports feeling quite  well.  She is taking Anastrozole daily.  She is tolerating it well.  She walks daily.  She notes that she has no increase in arthralgias.  She denies any hot flashes, or vaginal dryness.      REVIEW OF SYSTEMS:  Review of Systems  Constitutional: Negative for appetite change, chills, fatigue, fever and unexpected weight change.  HENT:   Negative for hearing loss, lump/mass and trouble swallowing.   Eyes: Negative for eye problems and icterus.  Respiratory: Negative for chest tightness, cough and shortness of breath.   Cardiovascular: Negative for chest pain, leg swelling and palpitations.  Gastrointestinal: Negative for abdominal distention, abdominal pain, constipation, diarrhea, nausea and vomiting.  Endocrine: Negative for hot flashes.  Skin: Negative for itching and rash.  Neurological: Negative for dizziness, extremity weakness, headaches and numbness.  Hematological: Negative for adenopathy. Does not bruise/bleed easily.  Psychiatric/Behavioral: Negative for depression. The patient is not nervous/anxious.   Breast: Denies any new nodularity, masses, tenderness, nipple changes, or nipple discharge.      ONCOLOGY TREATMENT TEAM:  1. Surgeon:  Dr. Dalbert Batman at [facility] 2. Medical Oncologist: Dr. Jana Hakim  3. Radiation Oncologist: Dr. Lisbeth Renshaw    PAST MEDICAL/SURGICAL HISTORY:  Past Medical History:  Diagnosis Date  . Cancer ([place])   . Cataracts, bilateral   . Complication of anesthesia   . Hyperlipidemia   . Hypertension   . PONV (postoperative nausea and vomiting)  Past Surgical History:  Procedure Laterality Date  . BREAST BIOPSY Left 03/28/2017  . BREAST BIOPSY Left 04/13/2017  . BREAST LUMPECTOMY WITH RADIOACTIVE SEED AND SENTINEL LYMPH NODE BIOPSY Left 05/10/2017   Procedure: BREAST LUMPECTOMY WITH RADIOACTIVE SEED X2 AND SENTINEL LYMPH NODE BIOPSY;  Surgeon: Fanny Skates, MD;  Location: [facility];  Service: General;  Laterality: Left;  . CESAREAN SECTION     . FRACTURE SURGERY Right 2008   right leg fracture s/p pinning     ALLERGIES:  Allergies  Allergen Reactions  . Penicillins Rash and Other (See Comments)    Has patient had a PCN reaction causing immediate rash, facial/tongue/throat swelling, SOB or lightheadedness with hypotension: Unknown Has patient had a PCN reaction causing severe rash involving mucus membranes or skin necrosis: Unknown Has patient had a PCN reaction that required hospitalization: Unknown Has patient had a PCN reaction occurring within the last 10 years: No If all of the above answers are "NO", then may proceed with Cephalosporin use.   . Sulfa Antibiotics Rash     CURRENT MEDICATIONS:  Outpatient Encounter Medications as of 10/30/2017  Medication Sig  . acetaminophen (TYLENOL) 500 MG tablet Take 1,000 mg by mouth every 6 (six) hours as needed for moderate pain or headache.   . anastrozole (ARIMIDEX) 1 MG tablet Take 1 tablet (1 mg total) by mouth daily.  . bisoprolol-hydrochlorothiazide (ZIAC) 10-6.25 MG tablet Take 1 tablet by mouth daily.  . Omega-3 Fatty Acids (FISH OIL) 1200 MG CAPS Take 2,400 mg by mouth daily before lunch.   . vitamin C (ASCORBIC ACID) 500 MG tablet Take 500 mg by mouth daily before lunch.   . vitamin E 400 UNIT capsule Take 400 Units by mouth daily before lunch.   No facility-administered encounter medications on file as of 10/30/2017.      ONCOLOGIC FAMILY HISTORY:  Family History  Problem Relation Age of Onset  . Heart attack Father   . Breast cancer Cousin        maternal first cousin  . Breast cancer Cousin        maternal first cousin  . Cancer Neg Hx      GENETIC COUNSELING/TESTING: Not at this time  SOCIAL HISTORY:  Social History   Socioeconomic History  . Marital status: Widowed    Spouse name: Not on file  . Number of children: Not on file  . Years of education: Not on file  . Highest education level: Not on file  Occupational History  . Not on file    Social Needs  . Financial resource strain: Not on file  . Food insecurity:    Worry: Not on file    Inability: Not on file  . Transportation needs:    Medical: Not on file    Non-medical: Not on file  Tobacco Use  . Smoking status: Never Smoker  . Smokeless tobacco: Never Used  Substance and Sexual Activity  . Alcohol use: No  . Drug use: No  . Sexual activity: Yes  Lifestyle  . Physical activity:    Days per week: Not on file    Minutes per session: Not on file  . Stress: Not on file  Relationships  . Social connections:    Talks on phone: Not on file    Gets together: Not on file    Attends religious service: Not on file    Active member of club or organization: Not on file    Attends meetings of clubs  or organizations: Not on file    Relationship status: Not on file  . Intimate partner violence:    Fear of current or ex partner: No    Emotionally abused: No    Physically abused: No    Forced sexual activity: No  Other Topics Concern  . Not on file  Social History Narrative  . Not on file      PHYSICAL EXAMINATION:  Vital Signs:   Vitals:   10/30/17 1000  BP: (!) 146/69  Pulse: 66  Resp: 18  Temp: 98.5 F (36.9 C)  SpO2: 99%   Filed Weights   10/30/17 1000  Weight: 248 lb 12.8 oz (112.9 kg)   General: Well-nourished, well-appearing female in no acute distress.  She is unaccompanied today.   HEENT: Head is normocephalic.  Pupils equal and reactive to light. Conjunctivae clear without exudate.  Sclerae anicteric. Oral mucosa is pink, moist.  Oropharynx is pink without lesions or erythema.  Lymph: No cervical, supraclavicular, or infraclavicular lymphadenopathy noted on palpation.  Cardiovascular: Regular rate and rhythm.Marland Kitchen Respiratory: Clear to auscultation bilaterally. Chest expansion symmetric; breathing non-labored.  Breasts: right breast benign, left breast s/p lumpectomy and radiation, no sign of recurrence GI: Abdomen soft and round; non-tender,  non-distended. Bowel sounds normoactive.  GU: Deferred.  Neuro: No focal deficits. Steady gait.  Psych: Mood and affect normal and appropriate for situation.  Extremities: No edema. MSK: No focal spinal tenderness to palpation.  Full range of motion in bilateral upper extremities Skin: Warm and dry.  LABORATORY DATA:  None for this visit.  DIAGNOSTIC IMAGING:  None for this visit.      ASSESSMENT AND PLAN:  Ms.. Donaldson is a pleasant 62 y.o. female with Stage IA left breast invasive ductal carcinoma, ER+/PR+/HER2-, diagnosed in 03/2017, treated with lumpectomy, adjuvant radiation therapy, and anti-estrogen therapy with Anastrozole beginning in 07/2017.  She presents to the Survivorship Clinic for our initial meeting and routine follow-up post-completion of treatment for breast cancer.    1. Stage IA left breast cancer:  Meredith Donaldson is continuing to recover from definitive treatment for breast cancer. She will follow-up with her medical oncologist, Dr. Jana Hakim in 3 months with history and physical exam per surveillance protocol.  She will continue her anti-estrogen therapy with Anastrozole. Thus far, she is tolerating the Anastrozole well, with minimal side effects. Today, a comprehensive survivorship care plan and treatment summary was reviewed with the patient today detailing her breast cancer diagnosis, treatment course, potential late/long-term effects of treatment, appropriate follow-up care with recommendations for the future, and patient education resources.  A copy of this summary, along with a letter will be sent to the patient's primary care provider via mail/fax/In Basket message after today's visit.    2. Bone health:  Given Meredith Donaldson's age/history of breast cancer and her current treatment regimen including anti-estrogen therapy with Anastrozole, she is at risk for bone demineralization.  Her last DEXA scan was 10/03/2017, which showed a T score of -0.8 which is normal.  She was given  education on specific activities to promote bone health.  3. Cancer screening:  Due to Meredith Donaldson's history and her age, she should receive screening for skin cancers, colon cancer, and gynecologic cancers.  The information and recommendations are listed on the patient's comprehensive care plan/treatment summary and were reviewed in detail with the patient.    4. Health maintenance and wellness promotion: Meredith Donaldson was encouraged to consume 5-7 servings of fruits and vegetables per day.  We reviewed the "Nutrition Rainbow" handout, as well as the handout "Take Control of Your Health and Reduce Your Cancer Risk" from the [employer].  She was also encouraged to engage in moderate to vigorous exercise for 30 minutes per day most days of the week. We discussed the [employer] fitness program, which is designed for cancer survivors to help them become more physically fit after cancer treatments.  She was instructed to limit her alcohol consumption and continue to abstain from tobacco use.     5. Support services/counseling: It is not uncommon for this period of the patient's cancer care trajectory to be one of many emotions and stressors.  We discussed an opportunity for her to participate in the next session of [facility] ("Finding Your New Normal") support group series designed for patients after they have completed treatment.   Meredith Donaldson was encouraged to take advantage of our many other support services programs, support groups, and/or counseling in coping with her new life as a cancer survivor after completing anti-cancer treatment.  She was offered support today through active listening and expressive supportive counseling.  She was given information regarding our available services and encouraged to contact me with any questions or for help enrolling in any of our support group/programs.    Dispo:   -Return to cancer center 12/2017 for f/u with Dr. Doris Cheadle -Mammogram due in 03/2018 ?  ultrasound -Follow up with Dr. Dalbert Batman 03/2018 -She is welcome to return back to the Survivorship Clinic at any time; no additional follow-up needed at this time.  -Consider referral back to survivorship as a long-term survivor for continued surveillance  A total of (30) minutes of face-to-face time was spent with this patient with greater than 50% of that time in counseling and care-coordination.   Gardenia Phlegm, NP Survivorship Program [facility] [phone]   Note: PRIMARY CARE PROVIDER Jake Samples, [place] [phone]

## 2017-10-30 NOTE — Patient Instructions (Signed)

## 2017-10-30 NOTE — Telephone Encounter (Signed)
Per 10/7 los, no orders.  [facility] will call patient with mammo appt.

## 2017-11-01 ENCOUNTER — Other Ambulatory Visit: Payer: Self-pay | Admitting: Adult Health

## 2017-11-01 DIAGNOSIS — Z170 Estrogen receptor positive status [ER+]: Principal | ICD-10-CM

## 2017-11-01 DIAGNOSIS — C50312 Malignant neoplasm of lower-inner quadrant of left female breast: Secondary | ICD-10-CM

## 2017-12-26 ENCOUNTER — Other Ambulatory Visit: Payer: BLUE CROSS/BLUE SHIELD

## 2017-12-27 ENCOUNTER — Other Ambulatory Visit: Payer: Self-pay

## 2017-12-27 DIAGNOSIS — Z170 Estrogen receptor positive status [ER+]: Principal | ICD-10-CM

## 2017-12-27 DIAGNOSIS — C50312 Malignant neoplasm of lower-inner quadrant of left female breast: Secondary | ICD-10-CM

## 2017-12-27 NOTE — Progress Notes (Signed)
[place]  [phone] Fax:([phone]     ID: [name] DOB: Sep 23, 1955  MR#: [id number]  TDS#:[id number]  Patient Care Team: Scherrie Bateman as PCP - General (Family Medicine) Dashanti Burr, Virgie Dad, MD as Consulting Physician (Oncology) Fanny Skates, MD as Consulting Physician (General Surgery) Kyung Rudd, MD as Consulting Physician (Radiation Oncology) Delice Bison, Charlestine Massed, NP as Nurse Practitioner (Hematology and Oncology) OTHER MD:   CHIEF COMPLAINT: Estrogen receptor positive breast cancer  CURRENT TREATMENT: Anastrozole   HISTORY OF CURRENT ILLNESS: From the original intake note:  Meredith Donaldson had routine screening mammography on 03/03/2017 showing a possible abnormality in the left breast. She underwent unilateral left diagnostic mammography with tomography and left breast ultrasonography at [facility] on 03/14/2017 showing: breast density category B. There are were multiple hypoechoic masses of varying sizes in the lower inner quadrant of the left breast found sonographically. A dominant mass at the 7:30 position measuring 0.9 x 0.9 x 0.8 cm near the inframammary fold. Another mass at the 7:30 slightly deep and lateral measuring 0.4 x 0.3 cm. At 8 o'clock position, near the medial margin of the breast is a second dominant mass measuring 0.8 x 0.8 x 0.9 cm. Slightly medial to this is a similar-appearing hypoechoic mass measuring 0.4 x 0.4 x 0.5 cm. Immediately lateral to the dominant mass at 8:00 position, located 12 cm from the nipple, is a 0.4 x 0.4 x 0.5 cm mass. Slightly more superficially at 8 o'clock position 12 cm from the nipple is an irregular hypoechoic mass measuring 0.7 cm maximum diameter. Ultrasound of the left axilla was negative for lymphadenopathy.   Accordingly on 03/28/2017 she proceeded to biopsy of the left breast area in question. The pathology from this procedure showed ([id number]): At the 8  o'clock position, invasive ductal carcinoma with papillary features. Prognostic indicators significant for: estrogen receptor, 100% positive and progesterone receptor, 100% positive, both with strong staining intensity. Proliferation marker Ki67 at 2%. HER2 not amplified with ratios HER2/CEP17 signals 1.13 and average HER2 copies per cell 1.80  On 04/10/2017 the patient had bilateral breast MRI, showing an area up to 5.4 cm including 2 separate masses and an area of non-masslike enhancement.  The mass that was previously biopsied is the more posterior one, near the inframammary fold.  The more anterior mass has not been biopsied and if lumpectomy is considered biopsying that mass would allow the entire span of abnormal enhancement to be included in the lumpectomy specimen.  This second MRI guided biopsy has been scheduled for 04/17/2017.  The right breast was unremarkable and there were no abnormal looking lymph nodes  The patient's subsequent history is as detailed below.   INTERVAL HISTORY: Meredith Donaldson returns today for follow-up of her estrogen receptor positive breast cancer.   The patient continues on anastrozle, which she is tolerating well. She doesn't get a lot of hot flashes. She has no vaginal dryness problems. She is currently having her treatment covered by her insurance, but once her policy resets in January, she will begin paying $103 per month for her treatments from [facility].    Her last bone density screening on 10/03/2017, showed a T-score of -0.8, which is considered normal.   She is scheduled for a routine mammogram and an ultrasound on 03/05/2018.     REVIEW OF SYSTEMS: Meredith Donaldson is doing well overall. One of her grandchildren has a fever. Her significant other's sister, Meredith Donaldson, had a bad car accident last  tuesday, but is recovering. The patient denies unusual headaches, visual changes, nausea, vomiting, or dizziness. There has been no unusual cough, phlegm production, or pleurisy.  This been no change in bowel or bladder habits. The patient denies unexplained fatigue or unexplained weight loss, bleeding, rash, or fever. A detailed review of systems was otherwise noncontributory.    PAST MEDICAL HISTORY: Past Medical History:  Diagnosis Date  . Cancer ([facility])   . Cataracts, bilateral   . Complication of anesthesia   . Hyperlipidemia   . Hypertension   . PONV (postoperative nausea and vomiting)     PAST SURGICAL HISTORY: Past Surgical History:  Procedure Laterality Date  . BREAST BIOPSY Left 03/28/2017  . BREAST BIOPSY Left 04/13/2017  . BREAST LUMPECTOMY WITH RADIOACTIVE SEED AND SENTINEL LYMPH NODE BIOPSY Left 05/10/2017   Procedure: BREAST LUMPECTOMY WITH RADIOACTIVE SEED X2 AND SENTINEL LYMPH NODE BIOPSY;  Surgeon: Fanny Skates, MD;  Location: [facility];  Service: General;  Laterality: Left;  . CESAREAN SECTION    . FRACTURE SURGERY Right 2008   right leg fracture s/p pinning    FAMILY HISTORY Family History  Problem Relation Age of Onset  . Heart attack Father   . Breast cancer Cousin        maternal first cousin  . Breast cancer Cousin        maternal first cousin  . Cancer Neg Hx     The patient's father died at age 72 due to several heart attacks and stokes. The patient's mother passed at age 62 due to old age. The patient has 4 brothers and 2 sisters. She denies a family history of breast or ovarian cancer.   GYNECOLOGIC HISTORY:  No LMP recorded. Patient is postmenopausal. Menarche: 62 years old Age at first live birth: 62 years old The patient is GX P2 LMP: at age 71 She took low dose oral contraceptive pills briefly after her 1st birth, with no complications. She had bilateral tubal ligation after her 2nd birth due to pregnancy complications. She retained her ovaries. She did not take HRT.    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 Her husband passed away years ago due to cancer (lymphoma).  She lives with her significant other of 7+ years, [name], who is retired from being a Furniture conservator/restorer. [name] also helps with farm work. The patient's oldest son, Meredith Donaldson "Corene Cornea" Cressy is a Company secretary at [employer]. The patient's second son, "Christia [place]" Naava Janeway, was a Engineer, building services but is now a Librarian, academic and lives near the patient's farm. The patient has 7 grandchildren.  She has two chihuahuas and a 120 pound [place], as well as multiple Chiropodist. She does not attend church.      ADVANCED DIRECTIVES: To be addressed   HEALTH MAINTENANCE: Social History   Tobacco Use  . Smoking status: Never Smoker  . Smokeless tobacco: Never Used  Substance Use Topics  . Alcohol use: No  . Drug use: No     Colonoscopy: never  PAP: [place]/ not UTD  Bone density: never     Allergies  Allergen Reactions  . Penicillins Rash and Other (See Comments)    Has patient had a PCN reaction causing immediate rash, facial/tongue/throat swelling, SOB or lightheadedness with hypotension: Unknown Has patient had a PCN reaction causing severe rash involving mucus membranes or skin necrosis: Unknown Has patient had a PCN reaction that required  hospitalization: Unknown Has patient had a PCN reaction occurring within the last 10 years: No If all of the above answers are "NO", then may proceed with Cephalosporin use.   . Sulfa Antibiotics Rash    Current Outpatient Medications  Medication Sig Dispense Refill  . acetaminophen (TYLENOL) 500 MG tablet Take 1,000 mg by mouth every 6 (six) hours as needed for moderate pain or headache.     . anastrozole (ARIMIDEX) 1 MG tablet Take 1 tablet (1 mg total) by mouth daily. 90 tablet 4  . bisoprolol-hydrochlorothiazide (ZIAC) 10-6.25 MG tablet Take 1 tablet by mouth daily.  1  . Omega-3 Fatty Acids (FISH OIL) 1200 MG CAPS Take 2,400 mg by mouth daily before lunch.     . vitamin C (ASCORBIC ACID) 500 MG tablet Take 500 mg by  mouth daily before lunch.     . vitamin E 400 UNIT capsule Take 400 Units by mouth daily before lunch.     No current facility-administered medications for this visit.     OBJECTIVE: Middle-aged white woman in no acute distress  Vitals:   12/28/17 1025  BP: (!) 165/65  Pulse: 60  Temp: 98.2 F (36.8 C)  SpO2: 100%     Body mass index is 37.71 kg/m.   Wt Readings from Last 3 Encounters:  12/28/17 248 lb (112.5 kg)  10/30/17 248 lb 12.8 oz (112.9 kg)  09/04/17 251 lb 6.4 oz (114 kg)      ECOG FS:1 - Symptomatic but completely ambulatory  Sclerae unicteric, pupils round and equal Oropharynx clear and moist No cervical or supraclavicular adenopathy Lungs no rales or rhonchi Heart regular rate and rhythm Abd soft, nontender, positive bowel sounds MSK no focal spinal tenderness, no upper extremity lymphedema Neuro: nonfocal, well oriented, appropriate affect Breasts: Right breast is unremarkable.  The left breast has undergone lumpectomy and radiation.  The seroma in the medial to superior aspect of the breast persists.  It is approximately 8 x 5 cm, movable, nontender, with no associated erythema.  Both axillae are benign.  LAB RESULTS:  CMP     Component Value Date/Time   NA 140 12/28/2017 0941   K 3.4 (L) 12/28/2017 0941   CL 106 12/28/2017 0941   CO2 26 12/28/2017 0941   GLUCOSE 136 (H) 12/28/2017 0941   BUN 18 12/28/2017 0941   CREATININE 0.98 12/28/2017 0941   CALCIUM 9.4 12/28/2017 0941   PROT 7.3 12/28/2017 0941   ALBUMIN 3.9 12/28/2017 0941   AST 15 12/28/2017 0941   ALT 11 12/28/2017 0941   ALKPHOS 60 12/28/2017 0941   BILITOT 1.8 (H) 12/28/2017 0941   GFRNONAA >60 12/28/2017 0941   GFRAA >60 12/28/2017 0941    No results found for: TOTALPROTELP, ALBUMINELP, A1GS, A2GS, BETS, BETA2SER, GAMS, MSPIKE, SPEI  No results found for: KPAFRELGTCHN, LAMBDASER, KAPLAMBRATIO  Lab Results  Component Value Date   WBC 5.1 12/28/2017   NEUTROABS 2.7 12/28/2017    HGB 12.1 12/28/2017   HCT 36.8 12/28/2017   MCV 98.4 12/28/2017   PLT 223 12/28/2017    @LASTCHEMISTRY @  No results found for: LABCA2  No components found for: ZYSAYT016  No results for input(s): INR in the last 168 hours.  No results found for: LABCA2  No results found for: WFU932  No results found for: TFT732  No results found for: KGU542  No results found for: CA2729  No components found for: HGQUANT  No results found for: CEA1 / No results found for:  CEA1   No results found for: AFPTUMOR  No results found for: [place]  No results found for: PSA1  Appointment on 12/28/2017  Component Date Value Ref Range Status  . WBC Count 12/28/2017 5.1  4.0 - 10.5 K/uL Final  . RBC 12/28/2017 3.74* 3.87 - 5.11 MIL/uL Final  . Hemoglobin 12/28/2017 12.1  12.0 - 15.0 g/dL Final  . HCT 12/28/2017 36.8  36.0 - 46.0 % Final  . MCV 12/28/2017 98.4  80.0 - 100.0 fL Final  . MCH 12/28/2017 32.4  26.0 - 34.0 pg Final  . MCHC 12/28/2017 32.9  30.0 - 36.0 g/dL Final  . RDW 12/28/2017 12.9  11.5 - 15.5 % Final  . Platelet Count 12/28/2017 223  150 - 400 K/uL Final  . nRBC 12/28/2017 0.0  0.0 - 0.2 % Final  . Neutrophils Relative % 12/28/2017 53  % Final  . Neutro Abs 12/28/2017 2.7  1.7 - 7.7 K/uL Final  . Lymphocytes Relative 12/28/2017 35  % Final  . Lymphs Abs 12/28/2017 1.8  0.7 - 4.0 K/uL Final  . Monocytes Relative 12/28/2017 8  % Final  . Monocytes Absolute 12/28/2017 0.4  0.1 - 1.0 K/uL Final  . Eosinophils Relative 12/28/2017 3  % Final  . Eosinophils Absolute 12/28/2017 0.1  0.0 - 0.5 K/uL Final  . Basophils Relative 12/28/2017 1  % Final  . Basophils Absolute 12/28/2017 0.0  0.0 - 0.1 K/uL Final  . Immature Granulocytes 12/28/2017 0  % Final  . Abs Immature Granulocytes 12/28/2017 0.01  0.00 - 0.07 K/uL Final   Performed at [facility], [place] [address] [place], [place] [address]  . Sodium 12/28/2017 140  135 - 145 mmol/L Final  . Potassium  12/28/2017 3.4* 3.5 - 5.1 mmol/L Final  . Chloride 12/28/2017 106  98 - 111 mmol/L Final  . CO2 12/28/2017 26  22 - 32 mmol/L Final  . Glucose, Bld 12/28/2017 136* 70 - 99 mg/dL Final  . BUN 12/28/2017 18  8 - 23 mg/dL Final  . Creatinine 12/28/2017 0.98  0.44 - 1.00 mg/dL Final  . Calcium 12/28/2017 9.4  8.9 - 10.3 mg/dL Final  . Total Protein 12/28/2017 7.3  6.5 - 8.1 g/dL Final  . Albumin 12/28/2017 3.9  3.5 - 5.0 g/dL Final  . AST 12/28/2017 15  15 - 41 U/L Final  . ALT 12/28/2017 11  0 - 44 U/L Final  . Alkaline Phosphatase 12/28/2017 60  38 - 126 U/L Final  . Total Bilirubin 12/28/2017 1.8* 0.3 - 1.2 mg/dL Final  . GFR, Est Non Af Am 12/28/2017 >60  >60 mL/min Final  . GFR, Est AFR Am 12/28/2017 >60  >60 mL/min Final  . Anion gap 12/28/2017 8  5 - 15 Final   Performed at [facility], [address] [place], [place] [address]    (this displays the last labs from the last 3 days)  No results found for: TOTALPROTELP, ALBUMINELP, A1GS, A2GS, BETS, BETA2SER, GAMS, MSPIKE, SPEI (this displays SPEP labs)  No results found for: KPAFRELGTCHN, LAMBDASER, KAPLAMBRATIO (kappa/lambda light chains)  No results found for: HGBA, HGBA2QUANT, HGBFQUANT, HGBSQUAN (Hemoglobinopathy evaluation)   No results found for: LDH  No results found for: IRON, TIBC, IRONPCTSAT (Iron and TIBC)  No results found for: FERRITIN  Urinalysis    Component Value Date/Time   COLORURINE YELLOW 07/29/2017 [place] 07/29/2017 1231   LABSPEC 1.010 07/29/2017 1231   [place]  5.0 07/29/2017 [place] 07/29/2017 1231   HGBUR NEGATIVE 07/29/2017 1231   BILIRUBINUR NEGATIVE 07/29/2017 1231   [place] 07/29/2017 1231   PROTEINUR NEGATIVE 07/29/2017 1231   NITRITE NEGATIVE 07/29/2017 1231   LEUKOCYTESUR NEGATIVE 07/29/2017 1231     STUDIES: No results found.  ELIGIBLE FOR AVAILABLE RESEARCH PROTOCOL: no  ASSESSMENT: 62 y.o.    [place] woman status post left breast lower inner quadrant biopsy 03/28/2017 for a clinical mT1b N0, stage I invasive ductal carcinoma, grade 2, estrogen and progesterone receptor strongly positive, with no HER-2 amplification, and an MIB-1 of 2%.  (1) status post left lumpectomy and sentinel lymph node sampling 05/10/2017 for an mpT2 pN0, stage IB invasive ductal carcinoma, grade 1, with negative margins.  (a) a total of 3 sentinel lymph nodes were removed  (2) The Oncotype DX score was 14, predicting a risk of outside the breast recurrence over the next 10 years of 4% if the patient's only systemic therapy is tamoxifen for 5 years.  It also predicts no significant benefit from chemotherapy.  (3) adjuvant radiation completed 07/21/2017  (4) anastrozole started 07/24/2017  (a) bone density 10/03/2017 showed a T score of -0.8 (normal).   PLAN: Louretta is now a little over half a year out from definitive surgery for her breast cancer, with no evidence of recurrent or residual disease.  She is tolerating anastrozole remarkably well and the plan will be to continue that a total of 5 years.  We reviewed her bone density which is very favorable.  This will be repeated in 2 years.  The cost of anastrozole in her case seems too high to me.  I have written her a paper prescription so she can "shop it" and hopefully get the cost down to less than $10 per month  She has mammography scheduled for February and she will see Dr. Dalbert Batman shortly after that.  She will then return to see me in September of next year  She knows to call for any other issues that may develop before that visit.  Annalise Mcdiarmid, Virgie Dad, MD  12/28/17 10:44 AM Medical Oncology and Hematology [facility] [address] [place], [place] [address] Tel. [phone]    Fax. [phone]   I, Jacqualyn Posey am acting as a Education administrator for Chauncey Cruel, MD.   I, Lurline Del MD, have reviewed the above documentation for  accuracy and completeness, and I agree with the above.

## 2017-12-28 ENCOUNTER — Inpatient Hospital Stay: Payer: BLUE CROSS/BLUE SHIELD

## 2017-12-28 ENCOUNTER — Inpatient Hospital Stay: Payer: BLUE CROSS/BLUE SHIELD | Attending: Oncology | Admitting: Oncology

## 2017-12-28 ENCOUNTER — Telehealth: Payer: Self-pay | Admitting: Oncology

## 2017-12-28 VITALS — BP 165/65 | HR 60 | Temp 98.2°F | Ht 68.0 in | Wt 248.0 lb

## 2017-12-28 DIAGNOSIS — C50312 Malignant neoplasm of lower-inner quadrant of left female breast: Secondary | ICD-10-CM | POA: Diagnosis not present

## 2017-12-28 DIAGNOSIS — Z170 Estrogen receptor positive status [ER+]: Secondary | ICD-10-CM | POA: Diagnosis not present

## 2017-12-28 LAB — CBC WITH DIFFERENTIAL (CANCER CENTER ONLY)
Abs Immature Granulocytes: 0.01 10*3/uL (ref 0.00–0.07)
BASOS PCT: 1 %
Basophils Absolute: 0 10*3/uL (ref 0.0–0.1)
EOS PCT: 3 %
Eosinophils Absolute: 0.1 10*3/uL (ref 0.0–0.5)
HCT: 36.8 % (ref 36.0–46.0)
Hemoglobin: 12.1 g/dL (ref 12.0–15.0)
IMMATURE GRANULOCYTES: 0 %
Lymphocytes Relative: 35 %
Lymphs Abs: 1.8 10*3/uL (ref 0.7–4.0)
MCH: 32.4 pg (ref 26.0–34.0)
MCHC: 32.9 g/dL (ref 30.0–36.0)
MCV: 98.4 fL (ref 80.0–100.0)
MONO ABS: 0.4 10*3/uL (ref 0.1–1.0)
MONOS PCT: 8 %
NEUTROS PCT: 53 %
Neutro Abs: 2.7 10*3/uL (ref 1.7–7.7)
PLATELETS: 223 10*3/uL (ref 150–400)
RBC: 3.74 MIL/uL — ABNORMAL LOW (ref 3.87–5.11)
RDW: 12.9 % (ref 11.5–15.5)
WBC Count: 5.1 10*3/uL (ref 4.0–10.5)
nRBC: 0 % (ref 0.0–0.2)

## 2017-12-28 LAB — CMP (CANCER CENTER ONLY)
ALT: 11 U/L (ref 0–44)
AST: 15 U/L (ref 15–41)
Albumin: 3.9 g/dL (ref 3.5–5.0)
Alkaline Phosphatase: 60 U/L (ref 38–126)
Anion gap: 8 (ref 5–15)
BUN: 18 mg/dL (ref 8–23)
CHLORIDE: 106 mmol/L (ref 98–111)
CO2: 26 mmol/L (ref 22–32)
Calcium: 9.4 mg/dL (ref 8.9–10.3)
Creatinine: 0.98 mg/dL (ref 0.44–1.00)
Glucose, Bld: 136 mg/dL — ABNORMAL HIGH (ref 70–99)
POTASSIUM: 3.4 mmol/L — AB (ref 3.5–5.1)
Sodium: 140 mmol/L (ref 135–145)
Total Bilirubin: 1.8 mg/dL — ABNORMAL HIGH (ref 0.3–1.2)
Total Protein: 7.3 g/dL (ref 6.5–8.1)

## 2017-12-28 NOTE — Telephone Encounter (Signed)
Gave avs and calendar ° °

## 2018-03-05 ENCOUNTER — Ambulatory Visit: Payer: BLUE CROSS/BLUE SHIELD

## 2018-03-05 ENCOUNTER — Ambulatory Visit
Admission: RE | Admit: 2018-03-05 | Discharge: 2018-03-05 | Disposition: A | Discharge status: Home / Self Care | Payer: BLUE CROSS/BLUE SHIELD | Source: Ambulatory Visit | Attending: Adult Health | Admitting: Adult Health

## 2018-03-05 DIAGNOSIS — Z170 Estrogen receptor positive status [ER+]: Principal | ICD-10-CM

## 2018-03-05 DIAGNOSIS — Z853 Personal history of malignant neoplasm of breast: Secondary | ICD-10-CM | POA: Diagnosis not present

## 2018-03-05 DIAGNOSIS — C50312 Malignant neoplasm of lower-inner quadrant of left female breast: Secondary | ICD-10-CM

## 2018-03-05 DIAGNOSIS — R928 Other abnormal and inconclusive findings on diagnostic imaging of breast: Secondary | ICD-10-CM | POA: Diagnosis not present

## 2018-03-05 HISTORY — DX: Personal history of irradiation: Z92.3

## 2018-03-29 DIAGNOSIS — Z1389 Encounter for screening for other disorder: Secondary | ICD-10-CM | POA: Diagnosis not present

## 2018-03-29 DIAGNOSIS — Z6839 Body mass index (BMI) 39.0-39.9, adult: Secondary | ICD-10-CM | POA: Diagnosis not present

## 2018-03-29 DIAGNOSIS — I10 Essential (primary) hypertension: Secondary | ICD-10-CM | POA: Diagnosis not present

## 2018-09-22 ENCOUNTER — Other Ambulatory Visit: Payer: Self-pay | Admitting: Oncology

## 2018-10-07 NOTE — Progress Notes (Signed)
[facility]  [phone] [phone]     ID: Meredith Donaldson DOB: 27-Oct-1955  MR#: [id number]  TLX#:[id number]  Patient Care Team: Scherrie Bateman as PCP - General (Family Medicine) Magrinat, Virgie Dad, MD as Consulting Physician (Oncology) Fanny Skates, MD as Consulting Physician (General Surgery) Kyung Rudd, MD as Consulting Physician (Radiation Oncology) Delice Bison, Charlestine Massed, NP as Nurse Practitioner (Hematology and Oncology) OTHER MD:   CHIEF COMPLAINT: Estrogen receptor positive breast cancer  CURRENT TREATMENT: Anastrozole   HISTORY OF CURRENT ILLNESS: From the original intake note:  Meredith Donaldson had routine screening mammography on 03/03/2017 showing a possible abnormality in the left breast. She underwent unilateral left diagnostic mammography with tomography and left breast ultrasonography at [facility] on 03/14/2017 showing: breast density category B. There are were multiple hypoechoic masses of varying sizes in the lower inner quadrant of the left breast found sonographically. A dominant mass at the 7:30 position measuring 0.9 x 0.9 x 0.8 cm near the inframammary fold. Another mass at the 7:30 slightly deep and lateral measuring 0.4 x 0.3 cm. At 8 o'clock position, near the medial margin of the breast is a second dominant mass measuring 0.8 x 0.8 x 0.9 cm. Slightly medial to this is a similar-appearing hypoechoic mass measuring 0.4 x 0.4 x 0.5 cm. Immediately lateral to the dominant mass at 8:00 position, located 12 cm from the nipple, is a 0.4 x 0.4 x 0.5 cm mass. Slightly more superficially at 8 o'clock position 12 cm from the nipple is an irregular hypoechoic mass measuring 0.7 cm maximum diameter. Ultrasound of the left axilla was negative for lymphadenopathy.   Accordingly on 03/28/2017 she proceeded to biopsy of the left breast area in question. The pathology from this procedure showed ([id number]): At the 8  o'clock position, invasive ductal carcinoma with papillary features. Prognostic indicators significant for: estrogen receptor, 100% positive and progesterone receptor, 100% positive, both with strong staining intensity. Proliferation marker Ki67 at 2%. HER2 not amplified with ratios HER2/CEP17 signals 1.13 and average HER2 copies per cell 1.80  On 04/10/2017 the patient had bilateral breast MRI, showing an area up to 5.4 cm including 2 separate masses and an area of non-masslike enhancement.  The mass that was previously biopsied is the more posterior one, near the inframammary fold.  The more anterior mass has not been biopsied and if lumpectomy is considered biopsying that mass would allow the entire span of abnormal enhancement to be included in the lumpectomy specimen.  This second MRI guided biopsy has been scheduled for 04/17/2017.  The right breast was unremarkable and there were no abnormal looking lymph nodes  The patient's subsequent history is as detailed below.   INTERVAL HISTORY: Meredith Donaldson returns today for follow-up and treatment of her estrogen receptor positive breast cancer. She was last seen here on 12/28/2017.   She continues on anastrozole  She feels a little hotter than usual, but does not have frank hot flashes.  There is a little bit of vaginal itching but no true dryness problems.  She is now obtaining it at a better price.  Meredith Donaldson's last bone density screening on 10/03/2017, showed a T-score of -0.8, which is considered normal.    Since her last visit here, she underwent a digital diagnostic bilateral mammogram with tomography on 03/05/2018 showing: Breast Density Category B. There is a seroma measuring approximately 6 cm at the lumpectomy site. No suspicious changes are seen at the lumpectomy site. No mammographic  evidence of malignancy in the bilateral breasts.   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  She mostly walks for exercise.  She does a lot of  weeding and therefore gets bit a lot.  She has quite a few insect bites on her skin right now.  Aside from these issues a detailed review of systems today was entirely benign.   PAST MEDICAL HISTORY: Past Medical History:  Diagnosis Date  . Cancer ([place])   . Cataracts, bilateral   . Complication of anesthesia   . Hyperlipidemia   . Hypertension   . Personal history of radiation therapy    left breast 2019  . PONV (postoperative nausea and vomiting)     PAST SURGICAL HISTORY: Past Surgical History:  Procedure Laterality Date  . BREAST BIOPSY Left 03/28/2017  . BREAST BIOPSY Left 04/13/2017  . BREAST LUMPECTOMY Left 05/10/2017  . BREAST LUMPECTOMY WITH RADIOACTIVE SEED AND SENTINEL LYMPH NODE BIOPSY Left 05/10/2017   Procedure: BREAST LUMPECTOMY WITH RADIOACTIVE SEED X2 AND SENTINEL LYMPH NODE BIOPSY;  Surgeon: Fanny Skates, MD;  Location: [facility];  Service: General;  Laterality: Left;  . CESAREAN SECTION    . FRACTURE SURGERY Right 2008   right leg fracture s/p pinning    FAMILY HISTORY Family History  Problem Relation Age of Onset  . Heart attack Father   . Breast cancer Cousin        maternal first cousin  . Breast cancer Cousin        maternal first cousin  . Cancer Neg Hx     The patient's father died at age 33 due to several heart attacks and stokes. The patient's mother passed at age 58 due to old age. The patient has 4 brothers and 2 sisters. She denies a family history of breast or ovarian cancer.   GYNECOLOGIC HISTORY:  No LMP recorded. Patient is postmenopausal. Menarche: 63 years old Age at first live birth: 63 years old The patient is GX P2 LMP: at age 32 She took low dose oral contraceptive pills briefly after her 1st birth, with no complications. She had bilateral tubal ligation after her 2nd birth due to pregnancy complications. She retained her ovaries. She did not take HRT.    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 Her husband passed away years ago due to cancer (lymphoma). She lives with her significant other of 7+ years, Meredith Donaldson, who is retired from being a Furniture conservator/restorer. 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 The patient's oldest son, Meredith Donaldson is a Company secretary at [employer]. The patient's second son, "Christia Reading" Averyana Donaldson, was a Engineer, building services but is now a Librarian, academic and lives near the patient's farm. The patient has 7 grandchildren.  She has two chihuahuas and a 120 pound [employer], that she obtained after 3 people broke into 1 of her trucks.  She also has multiple chickens and 2 peacocks. She does not attend church.      ADVANCED DIRECTIVES: To be addressed   HEALTH MAINTENANCE: Social History   Tobacco Use  . Smoking status: Never Smoker  . Smokeless tobacco: Never Used  Substance Use Topics  . Alcohol use: No  . Drug use: No     Colonoscopy: never  PAP: [place]/ not UTD  Bone density: never     Allergies  Allergen Reactions  . Penicillins Rash and Other (See Comments)  Has patient had a PCN reaction causing immediate rash, facial/tongue/throat swelling, SOB or lightheadedness with hypotension: Unknown Has patient had a PCN reaction causing severe rash involving mucus membranes or skin necrosis: Unknown Has patient had a PCN reaction that required hospitalization: Unknown Has patient had a PCN reaction occurring within the last 10 years: No If all of the above answers are "NO", then may proceed with Cephalosporin use.   . Sulfa Antibiotics Rash    Current Outpatient Medications  Medication Sig Dispense Refill  . acetaminophen (TYLENOL) 500 MG tablet Take 1,000 mg by mouth every 6 (six) hours as needed for moderate pain or headache.     . anastrozole (ARIMIDEX) 1 MG tablet Take 1 tablet by mouth once daily 90 tablet 0  . bisoprolol-hydrochlorothiazide (ZIAC) 10-6.25 MG tablet Take 1 tablet by mouth daily.  1  . Omega-3 Fatty  Acids (FISH OIL) 1200 MG CAPS Take 2,400 mg by mouth daily before lunch.     . vitamin C (ASCORBIC ACID) 500 MG tablet Take 500 mg by mouth daily before lunch.     . vitamin E 400 UNIT capsule Take 400 Units by mouth daily before lunch.     No current facility-administered medications for this visit.     OBJECTIVE: Middle-aged white woman who appears stated age  23:   10/08/18 0957  BP: (!) 177/75  Pulse: 63  Resp: 20  Temp: 98.5 F (36.9 C)  SpO2: 100%   Wt Readings from Last 3 Encounters:  10/08/18 248 lb 9.6 oz (112.8 kg)  12/28/17 248 lb (112.5 kg)  10/30/17 248 lb 12.8 oz (112.9 kg)   Body mass index is 37.8 kg/m.    ECOG FS:1 - Symptomatic but completely ambulatory  Ocular: Sclerae unicteric, pupils round and equal Ear-nose-throat: Wearing a mask Lymphatic: No cervical or supraclavicular adenopathy Lungs no rales or rhonchi Heart regular rate and rhythm Abd soft, nontender, positive bowel sounds MSK no focal spinal tenderness, no joint edema Neuro: non-focal, well-oriented, appropriate affect Breasts: The right breast is benign.  The left breast is status post lumpectomy and radiation.  She continues to have a seroma in the superior aspect which is unchanged from baseline.  There is no evidence of local recurrence.  Both axillae are benign. Skin: Multiple insect bites chiefly around the waist area  LAB RESULTS:  CMP     Component Value Date/Time   NA 140 12/28/2017 0941   K 3.4 (L) 12/28/2017 0941   CL 106 12/28/2017 0941   CO2 26 12/28/2017 0941   GLUCOSE 136 (H) 12/28/2017 0941   BUN 18 12/28/2017 0941   CREATININE 0.98 12/28/2017 0941   CALCIUM 9.4 12/28/2017 0941   PROT 7.3 12/28/2017 0941   ALBUMIN 3.9 12/28/2017 0941   AST 15 12/28/2017 0941   ALT 11 12/28/2017 0941   ALKPHOS 60 12/28/2017 0941   BILITOT 1.8 (H) 12/28/2017 0941   GFRNONAA >60 12/28/2017 0941   GFRAA >60 12/28/2017 0941    No results found for: TOTALPROTELP, ALBUMINELP,  A1GS, A2GS, BETS, BETA2SER, GAMS, MSPIKE, SPEI  No results found for: KPAFRELGTCHN, LAMBDASER, KAPLAMBRATIO  Lab Results  Component Value Date   WBC 7.0 10/08/2018   NEUTROABS 4.0 10/08/2018   HGB 12.7 10/08/2018   HCT 38.3 10/08/2018   MCV 97.7 10/08/2018   PLT 243 10/08/2018    @LASTCHEMISTRY @  No results found for: LABCA2  No components found for: [id number]  No results for input(s): INR in the last 168 hours.  No results found for: LABCA2  No results found for: ZOX096  No results found for: EAV409  No results found for: WJX914  No results found for: CA2729  No components found for: HGQUANT  No results found for: CEA1 / No results found for: CEA1   No results found for: AFPTUMOR  No results found for: CHROMOGRNA  No results found for: PSA1  Appointment on 10/08/2018  Component Date Value Ref Range Status  . WBC 10/08/2018 7.0  4.0 - 10.5 K/uL Final  . RBC 10/08/2018 3.92  3.87 - 5.11 MIL/uL Final  . Hemoglobin 10/08/2018 12.7  12.0 - 15.0 g/dL Final  . HCT 10/08/2018 38.3  36.0 - 46.0 % Final  . MCV 10/08/2018 97.7  80.0 - 100.0 fL Final  . MCH 10/08/2018 32.4  26.0 - 34.0 pg Final  . MCHC 10/08/2018 33.2  30.0 - 36.0 g/dL Final  . RDW 10/08/2018 12.7  11.5 - 15.5 % Final  . Platelets 10/08/2018 243  150 - 400 K/uL Final  . nRBC 10/08/2018 0.0  0.0 - 0.2 % Final  . Neutrophils Relative % 10/08/2018 56  % Final  . Neutro Abs 10/08/2018 4.0  1.7 - 7.7 K/uL Final  . Lymphocytes Relative 10/08/2018 32  % Final  . Lymphs Abs 10/08/2018 2.3  0.7 - 4.0 K/uL Final  . Monocytes Relative 10/08/2018 9  % Final  . Monocytes Absolute 10/08/2018 0.6  0.1 - 1.0 K/uL Final  . Eosinophils Relative 10/08/2018 2  % Final  . Eosinophils Absolute 10/08/2018 0.1  0.0 - 0.5 K/uL Final  . Basophils Relative 10/08/2018 1  % Final  . Basophils Absolute 10/08/2018 0.1  0.0 - 0.1 K/uL Final  . Immature Granulocytes 10/08/2018 0  % Final  . Abs Immature Granulocytes 10/08/2018  0.02  0.00 - 0.07 K/uL Final   Performed at [facility], [address] [place], [place] [address]    (this displays the last labs from the last 3 days)  No results found for: TOTALPROTELP, ALBUMINELP, A1GS, A2GS, BETS, BETA2SER, GAMS, MSPIKE, SPEI (this displays SPEP labs)  No results found for: KPAFRELGTCHN, LAMBDASER, KAPLAMBRATIO (kappa/lambda light chains)  No results found for: HGBA, HGBA2QUANT, HGBFQUANT, HGBSQUAN (Hemoglobinopathy evaluation)   No results found for: LDH  No results found for: IRON, TIBC, IRONPCTSAT (Iron and TIBC)  No results found for: FERRITIN  Urinalysis    Component Value Date/Time   COLORURINE YELLOW 07/29/2017 1231   APPEARANCEUR CLEAR 07/29/2017 1231   LABSPEC 1.010 07/29/2017 1231   PHURINE 5.0 07/29/2017 1231   [place] 07/29/2017 1231   HGBUR NEGATIVE 07/29/2017 [place] 07/29/2017 [place] 07/29/2017 1231   PROTEINUR NEGATIVE 07/29/2017 1231   NITRITE NEGATIVE 07/29/2017 1231   LEUKOCYTESUR NEGATIVE 07/29/2017 1231     STUDIES: No results found.  ELIGIBLE FOR AVAILABLE RESEARCH PROTOCOL: no  ASSESSMENT: 63 y.o.   [place] woman status post left breast lower inner quadrant biopsy 03/28/2017 for a clinical mT1b N0, stage I invasive ductal carcinoma, grade 2, estrogen and progesterone receptor strongly positive, with no HER-2 amplification, and an MIB-1 of 2%.  (1) status post left lumpectomy and sentinel lymph node sampling 05/10/2017 for an mpT2 pN0, stage IB invasive ductal carcinoma, grade 1, with negative margins.  (a) a total of 3 sentinel lymph nodes were removed  (2) The Oncotype DX score was 14, predicting a risk of outside the breast recurrence over the next 10 years  of 4% if the patient's only systemic therapy is tamoxifen for 5 years.  It also predicts no significant benefit from chemotherapy.  (3) adjuvant radiation completed 07/21/2017  (4)  anastrozole started 07/24/2017  (a) bone density 10/03/2017 showed a T score of -0.8 (normal).   PLAN: Meredith Donaldson is now a little over a year out from definitive surgery for her breast cancer with no evidence of disease recurrence.  This is favorable.  She will have her next mammogram February 2021.  She would normally see Dr. Dalbert Batman March of that year but he is retiring so she will see me in March instead and from that point I will start seeing her on a once a year basis  She is keeping appropriate pandemic precautions.  I reviewed her lab work which shows an intermittently mildly elevated total bilirubin consistent with Gilbert's condition.  We discussed that today.  She knows to call for any other issue that may develop before the next visit. Magrinat, Virgie Dad, MD  10/08/18 10:19 AM Medical Oncology and Hematology [facility] [address] [place], [place] [address] Tel. [phone]    Fax. [phone]  I, Jacqualyn Posey am acting as a Education administrator for Chauncey Cruel, MD.   I, Lurline Del MD, have reviewed the above documentation for accuracy and completeness, and I agree with the above.

## 2018-10-08 ENCOUNTER — Other Ambulatory Visit: Payer: Self-pay

## 2018-10-08 ENCOUNTER — Telehealth: Payer: Self-pay

## 2018-10-08 ENCOUNTER — Inpatient Hospital Stay: Payer: BC Managed Care – PPO | Attending: Oncology

## 2018-10-08 ENCOUNTER — Inpatient Hospital Stay (HOSPITAL_BASED_OUTPATIENT_CLINIC_OR_DEPARTMENT_OTHER): Payer: BC Managed Care – PPO | Admitting: Oncology

## 2018-10-08 VITALS — BP 177/75 | HR 63 | Temp 98.5°F | Resp 20 | Ht 68.0 in | Wt 248.6 lb

## 2018-10-08 DIAGNOSIS — N898 Other specified noninflammatory disorders of vagina: Secondary | ICD-10-CM | POA: Diagnosis not present

## 2018-10-08 DIAGNOSIS — Z170 Estrogen receptor positive status [ER+]: Secondary | ICD-10-CM | POA: Diagnosis not present

## 2018-10-08 DIAGNOSIS — C50312 Malignant neoplasm of lower-inner quadrant of left female breast: Secondary | ICD-10-CM

## 2018-10-08 DIAGNOSIS — R17 Unspecified jaundice: Secondary | ICD-10-CM | POA: Insufficient documentation

## 2018-10-08 LAB — CBC WITH DIFFERENTIAL/PLATELET
Abs Immature Granulocytes: 0.02 10*3/uL (ref 0.00–0.07)
Basophils Absolute: 0.1 10*3/uL (ref 0.0–0.1)
Basophils Relative: 1 %
Eosinophils Absolute: 0.1 10*3/uL (ref 0.0–0.5)
Eosinophils Relative: 2 %
HCT: 38.3 % (ref 36.0–46.0)
Hemoglobin: 12.7 g/dL (ref 12.0–15.0)
Immature Granulocytes: 0 %
Lymphocytes Relative: 32 %
Lymphs Abs: 2.3 10*3/uL (ref 0.7–4.0)
MCH: 32.4 pg (ref 26.0–34.0)
MCHC: 33.2 g/dL (ref 30.0–36.0)
MCV: 97.7 fL (ref 80.0–100.0)
Monocytes Absolute: 0.6 10*3/uL (ref 0.1–1.0)
Monocytes Relative: 9 %
Neutro Abs: 4 10*3/uL (ref 1.7–7.7)
Neutrophils Relative %: 56 %
Platelets: 243 10*3/uL (ref 150–400)
RBC: 3.92 MIL/uL (ref 3.87–5.11)
RDW: 12.7 % (ref 11.5–15.5)
WBC: 7 10*3/uL (ref 4.0–10.5)
nRBC: 0 % (ref 0.0–0.2)

## 2018-10-08 LAB — COMPREHENSIVE METABOLIC PANEL
ALT: 12 U/L (ref 0–44)
AST: 16 U/L (ref 15–41)
Albumin: 4 g/dL (ref 3.5–5.0)
Alkaline Phosphatase: 74 U/L (ref 38–126)
Anion gap: 8 (ref 5–15)
BUN: 15 mg/dL (ref 8–23)
CO2: 28 mmol/L (ref 22–32)
Calcium: 9.5 mg/dL (ref 8.9–10.3)
Chloride: 104 mmol/L (ref 98–111)
Creatinine, Ser: 0.95 mg/dL (ref 0.44–1.00)
GFR calc Af Amer: >60 mL/min (ref >60)
GFR calc non Af Amer: >60 mL/min (ref >60)
Glucose, Bld: 111 mg/dL — ABNORMAL HIGH (ref 70–99)
Potassium: 3.2 mmol/L — ABNORMAL LOW (ref 3.5–5.1)
Sodium: 140 mmol/L (ref 135–145)
Total Bilirubin: 2 mg/dL — ABNORMAL HIGH (ref 0.3–1.2)
Total Protein: 7.4 g/dL (ref 6.5–8.1)

## 2018-10-08 NOTE — Telephone Encounter (Signed)
-----   Message from Gardenia Phlegm, NP sent at 10/08/2018  2:41 PM EDT ----- Patient potassium lower than prior.  Has she increased her potassium intake.  ----- Message ----- From: Interface, Lab In [place] Sent: 10/08/2018   9:57 AM EDT To: Chauncey Cruel, MD

## 2018-10-08 NOTE — Telephone Encounter (Signed)
LVM for patient encouraging her to increase potassium intake due to labs being a little worse than last visit.  Left center number for call back.

## 2018-10-09 ENCOUNTER — Telehealth: Payer: Self-pay | Admitting: Oncology

## 2018-10-09 NOTE — Telephone Encounter (Signed)
I left a message regarding schedule  

## 2018-10-31 DIAGNOSIS — Z23 Encounter for immunization: Secondary | ICD-10-CM | POA: Diagnosis not present

## 2018-12-23 ENCOUNTER — Other Ambulatory Visit: Payer: Self-pay | Admitting: Oncology

## 2018-12-24 ENCOUNTER — Other Ambulatory Visit: Payer: Self-pay | Admitting: Adult Health

## 2018-12-24 ENCOUNTER — Other Ambulatory Visit: Payer: Self-pay | Admitting: Oncology

## 2018-12-24 DIAGNOSIS — Z853 Personal history of malignant neoplasm of breast: Secondary | ICD-10-CM

## 2019-01-03 DIAGNOSIS — L219 Seborrheic dermatitis, unspecified: Secondary | ICD-10-CM | POA: Diagnosis not present

## 2019-01-03 DIAGNOSIS — Z6839 Body mass index (BMI) 39.0-39.9, adult: Secondary | ICD-10-CM | POA: Diagnosis not present

## 2019-03-24 ENCOUNTER — Other Ambulatory Visit: Payer: Self-pay | Admitting: Oncology

## 2019-04-08 ENCOUNTER — Other Ambulatory Visit: Payer: Self-pay

## 2019-04-08 ENCOUNTER — Ambulatory Visit
Admission: RE | Admit: 2019-04-08 | Discharge: 2019-04-08 | Disposition: A | Discharge status: Home / Self Care | Payer: BC Managed Care – PPO | Source: Ambulatory Visit | Attending: Oncology | Admitting: Oncology

## 2019-04-08 DIAGNOSIS — Z853 Personal history of malignant neoplasm of breast: Secondary | ICD-10-CM | POA: Diagnosis not present

## 2019-04-08 DIAGNOSIS — R928 Other abnormal and inconclusive findings on diagnostic imaging of breast: Secondary | ICD-10-CM | POA: Diagnosis not present

## 2019-04-14 NOTE — Progress Notes (Signed)
Meredith Donaldson  [phone] [phone]     ID: Meredith Donaldson DOB: 1955-08-13  MR#: [id number]  HYQ#:[id number]  Patient Care Team: Scherrie Bateman as PCP - General (Family Medicine) Hamsini Verrilli, Virgie Dad, MD as Consulting Physician (Oncology) Fanny Skates, MD as Consulting Physician (General Surgery) Kyung Rudd, MD as Consulting Physician (Radiation Oncology) Delice Bison, Charlestine Massed, NP as Nurse Practitioner (Hematology and Oncology) OTHER MD:   CHIEF COMPLAINT: Estrogen receptor positive breast cancer  CURRENT TREATMENT: Anastrozole   INTERVAL HISTORY: Meredith Donaldson returns today for follow-up of her estrogen receptor positive breast cancer.   She continues on anastrozole she tolerates it with essentially no side effects.  In particular hot flashes and vaginal dryness are not an issue.  Meredith Donaldson's last bone density screening on 10/03/2017, showed a T-score of -0.8, which is considered normal.    Since her last visit, she underwent bilateral diagnostic mammography with tomography at [facility] on 04/08/2019 showing: breast density category B; no evidence of malignancy in either breast.    REVIEW OF SYSTEMS: Meredith Donaldson has not yet had her Covid vaccine.  Her ASO Meredith Donaldson has had his first shot and did well with it.  Several of her family members including her 2 grandsons have had the COVID-19 illness but did well with it.  She has been very careful to stay at home and they had a very limited Christmas as a result of which she really hated but which did keep her safe.  HISTORY OF CURRENT ILLNESS: From the original intake note:  Meredith Donaldson had routine screening mammography on 03/03/2017 showing a possible abnormality in the left breast. She underwent unilateral left diagnostic mammography with tomography and left breast ultrasonography at [facility] on 03/14/2017 showing: breast density category B. There are were multiple  hypoechoic masses of varying sizes in the lower inner quadrant of the left breast found sonographically. A dominant mass at the 7:30 position measuring 0.9 x 0.9 x 0.8 cm near the inframammary fold. Another mass at the 7:30 slightly deep and lateral measuring 0.4 x 0.3 cm. At 8 o'clock position, near the medial margin of the breast is a second dominant mass measuring 0.8 x 0.8 x 0.9 cm. Slightly medial to this is a similar-appearing hypoechoic mass measuring 0.4 x 0.4 x 0.5 cm. Immediately lateral to the dominant mass at 8:00 position, located 12 cm from the nipple, is a 0.4 x 0.4 x 0.5 cm mass. Slightly more superficially at 8 o'clock position 12 cm from the nipple is an irregular hypoechoic mass measuring 0.7 cm maximum diameter. Ultrasound of the left axilla was negative for lymphadenopathy.   Accordingly on 03/28/2017 she proceeded to biopsy of the left breast area in question. The pathology from this procedure showed ([id number]): At the 8 o'clock position, invasive ductal carcinoma with papillary features. Prognostic indicators significant for: estrogen receptor, 100% positive and progesterone receptor, 100% positive, both with strong staining intensity. Proliferation marker Ki67 at 2%. HER2 not amplified with ratios HER2/CEP17 signals 1.13 and average HER2 copies per cell 1.80  On 04/10/2017 the patient had bilateral breast MRI, showing an area up to 5.4 cm including 2 separate masses and an area of non-masslike enhancement.  The mass that was previously biopsied is the more posterior one, near the inframammary fold.  The more anterior mass has not been biopsied and if lumpectomy is considered biopsying that mass would allow the entire span of abnormal enhancement to be included in the lumpectomy  specimen.  This second MRI guided biopsy has been scheduled for 04/17/2017.  The right breast was unremarkable and there were no abnormal looking lymph nodes  The patient's subsequent history is as detailed  below.   PAST MEDICAL HISTORY: Past Medical History:  Diagnosis Date  . Cancer ([facility])   . Cataracts, bilateral   . Complication of anesthesia   . Hyperlipidemia   . Hypertension   . Personal history of radiation therapy    left breast 2019  . PONV (postoperative nausea and vomiting)     PAST SURGICAL HISTORY: Past Surgical History:  Procedure Laterality Date  . BREAST BIOPSY Left 03/28/2017  . BREAST BIOPSY Left 04/13/2017  . BREAST LUMPECTOMY Left 05/10/2017  . BREAST LUMPECTOMY WITH RADIOACTIVE SEED AND SENTINEL LYMPH NODE BIOPSY Left 05/10/2017   Procedure: BREAST LUMPECTOMY WITH RADIOACTIVE SEED X2 AND SENTINEL LYMPH NODE BIOPSY;  Surgeon: Fanny Skates, MD;  Location: [facility];  Service: General;  Laterality: Left;  . CESAREAN SECTION    . FRACTURE SURGERY Right 2008   right leg fracture s/p pinning    FAMILY HISTORY Family History  Problem Relation Age of Onset  . Heart attack Father   . Breast cancer Cousin        maternal first cousin  . Breast cancer Cousin        maternal first cousin  . Cancer Neg Hx     The patient's father died at age 29 due to several heart attacks and stokes. The patient's mother passed at age 77 due to old age. The patient has 4 brothers and 2 sisters. She denies a family history of breast or ovarian cancer.   GYNECOLOGIC HISTORY:  No LMP recorded. Patient is postmenopausal. Menarche: 64 years old Age at first live birth: 64 years old The patient is GX P2 LMP: at age 31 She took low dose oral contraceptive pills briefly after her 1st birth, with no complications. She had bilateral tubal ligation after her 2nd birth due to pregnancy complications. She retained her ovaries. She did not take HRT.    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 Her husband passed away years ago due to cancer (lymphoma). She lives with her significant other of 7+ years, Meredith Donaldson, who is retired from being a  Furniture conservator/restorer. 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 The patient's oldest son, Meredith Donaldson is a Company secretary at [employer]. The patient's second son, "Christia Reading" Shawntae Donaldson, was a Engineer, building services but is now a Librarian, academic and lives near the patient's farm. The patient has 7 grandchildren.  She has two chihuahuas Romeo and Pierpont and a 120 pound [employer], that she obtained after 3 people broke into 1 of her trucks.  She also has multiple chickens and 4 peacocks. She does not attend church.      ADVANCED DIRECTIVES: To be addressed   HEALTH MAINTENANCE: Social History   Tobacco Use  . Smoking status: Never Smoker  . Smokeless tobacco: Never Used  Substance Use Topics  . Alcohol use: No  . Drug use: No     Colonoscopy: never  PAP: [facility]/ not UTD  Bone density: 09/2017, -0.8    Allergies  Allergen Reactions  . Penicillins Rash and Other (See Comments)    Has patient had a PCN reaction causing immediate rash, facial/tongue/throat swelling, SOB or lightheadedness with hypotension: Unknown Has patient had a PCN reaction causing severe rash involving  mucus membranes or skin necrosis: Unknown Has patient had a PCN reaction that required hospitalization: Unknown Has patient had a PCN reaction occurring within the last 10 years: No If all of the above answers are "NO", then may proceed with Cephalosporin use.   . Sulfa Antibiotics Rash    Current Outpatient Medications  Medication Sig Dispense Refill  . acetaminophen (TYLENOL) 500 MG tablet Take 1,000 mg by mouth every 6 (six) hours as needed for moderate pain or headache.     . anastrozole (ARIMIDEX) 1 MG tablet Take 1 tablet by mouth once daily 90 tablet 0  . bisoprolol-hydrochlorothiazide (ZIAC) 10-6.25 MG tablet Take 1 tablet by mouth daily.  1  . Omega-3 Fatty Acids (FISH OIL) 1200 MG CAPS Take 2,400 mg by mouth daily before lunch.     . vitamin C (ASCORBIC ACID) 500 MG tablet Take 500 mg by mouth  daily before lunch.     . vitamin E 400 UNIT capsule Take 400 Units by mouth daily before lunch.     No current facility-administered medications for this visit.    OBJECTIVE: White woman in no distress  Vitals:   04/15/19 1036  BP: (!) 166/66  Pulse: (!) 56  Resp: 18  Temp: 97.8 F (36.6 C)  SpO2: 99%   Wt Readings from Last 3 Encounters:  04/15/19 250 lb 8 oz (113.6 kg)  10/08/18 248 lb 9.6 oz (112.8 kg)  12/28/17 248 lb (112.5 kg)   Body mass index is 38.09 kg/m.    ECOG FS:1 - Symptomatic but completely ambulatory  Sclerae unicteric, EOMs intact Wearing a mask No cervical or supraclavicular adenopathy Lungs no rales or rhonchi Heart regular rate and rhythm Abd soft, nontender, positive bowel sounds MSK no focal spinal tenderness, no upper extremity lymphedema Neuro: nonfocal, well oriented, appropriate affect Breasts: The right breast is unremarkable.  The left breast status post lumpectomy and radiation.  There is no evidence of local recurrence.  Both axillae are benign.   LAB RESULTS:  CMP     Component Value Date/Time   NA 143 04/15/2019 0952   K 3.9 04/15/2019 0952   CL 107 04/15/2019 0952   CO2 26 04/15/2019 0952   GLUCOSE 123 (H) 04/15/2019 0952   BUN 14 04/15/2019 0952   CREATININE 0.91 04/15/2019 0952   CREATININE 0.98 12/28/2017 0941   CALCIUM 9.3 04/15/2019 0952   PROT 6.8 04/15/2019 0952   ALBUMIN 3.7 04/15/2019 0952   AST 14 (L) 04/15/2019 0952   AST 15 12/28/2017 0941   ALT 13 04/15/2019 0952   ALT 11 12/28/2017 0941   ALKPHOS 62 04/15/2019 0952   BILITOT 2.1 (H) 04/15/2019 0952   BILITOT 1.8 (H) 12/28/2017 0941   GFRNONAA >60 04/15/2019 0952   GFRNONAA >60 12/28/2017 0941   GFRAA >60 04/15/2019 0952   GFRAA >60 12/28/2017 0941    No results found for: TOTALPROTELP, ALBUMINELP, A1GS, A2GS, BETS, BETA2SER, GAMS, MSPIKE, SPEI  No results found for: KPAFRELGTCHN, LAMBDASER, [facility]  Lab Results  Component Value Date   WBC  5.2 04/15/2019   NEUTROABS 3.1 04/15/2019   HGB 12.2 04/15/2019   HCT 37.1 04/15/2019   MCV 98.7 04/15/2019   PLT 209 04/15/2019   No results found for: LABCA2  No components found for: HALPFX902  No results for input(s): INR in the last 168 hours.  No results found for: LABCA2  No results found for: IOX735  No results found for: HGD924  No results found for: QAS341  No results found for: CA2729  No components found for: HGQUANT  No results found for: CEA1 / No results found for: CEA1   No results found for: AFPTUMOR  No results found for: CHROMOGRNA  No results found for: HGBA, HGBA2QUANT, HGBFQUANT, HGBSQUAN (Hemoglobinopathy evaluation)   No results found for: LDH  No results found for: IRON, TIBC, IRONPCTSAT (Iron and TIBC)  No results found for: FERRITIN  Urinalysis    Component Value Date/Time   COLORURINE YELLOW 07/29/2017 1231   APPEARANCEUR CLEAR 07/29/2017 1231   LABSPEC 1.010 07/29/2017 1231   PHURINE 5.0 07/29/2017 1231   GLUCOSEU NEGATIVE 07/29/2017 1231   HGBUR NEGATIVE 07/29/2017 1231   BILIRUBINUR NEGATIVE 07/29/2017 1231   KETONESUR NEGATIVE 07/29/2017 1231   PROTEINUR NEGATIVE 07/29/2017 1231   NITRITE NEGATIVE 07/29/2017 1231   LEUKOCYTESUR NEGATIVE 07/29/2017 1231    STUDIES: MM DIAG BREAST TOMO BILATERAL  Result Date: 04/08/2019 CLINICAL DATA:  [age] patient presents for routine annual mammogram following treatment for multifocal left breast cancer in the lower inner quadrant diagnosed in February of 2019. EXAM: DIGITAL DIAGNOSTIC BILATERAL MAMMOGRAM WITH CAD AND TOMO COMPARISON:  Previous exam(s). ACR Breast Density Category b: There are scattered areas of fibroglandular density. FINDINGS: There are lumpectomy changes in the lower inner quadrant of the left breast posteriorly. Postoperative seroma is not completely imaged due to its far posterior position but appears slightly smaller compared to the prior mammogram of 2020.  No mass, nonsurgical distortion, or suspicious microcalcification is identified in either breast to suggest malignancy. Mammographic images were processed with CAD. IMPRESSION: Lumpectomy changes left breast. No evidence of malignancy bilaterally. RECOMMENDATION: Diagnostic mammogram is suggested in 1 year. (Code:[id number]-01Y) I have discussed the findings and recommendations with the patient. If applicable, a reminder letter will be sent to the patient regarding the next appointment. BI-RADS CATEGORY  2: Benign. Electronically Signed   By: Curlene Dolphin M.D.   On: 04/08/2019 11:07    ELIGIBLE FOR AVAILABLE RESEARCH PROTOCOL: no  ASSESSMENT: 65 y.o.  Esmeralda Oden woman status post left breast lower inner quadrant biopsy 03/28/2017 for a clinical mT1b N0, stage I invasive ductal carcinoma, grade 2, estrogen and progesterone receptor strongly positive, with no HER-2 amplification, and an MIB-1 of 2%.  (1) status post left lumpectomy and sentinel lymph node sampling 05/10/2017 for an mpT2 pN0, stage IB invasive ductal carcinoma, grade 1, with negative margins.  (a) a total of 3 sentinel lymph nodes were removed  (2) The Oncotype DX score was 14, predicting a risk of outside the breast recurrence over the next 10 years of 4% if the patient's only systemic therapy is tamoxifen for 5 years.  It also predicts no significant benefit from chemotherapy.  (3) adjuvant radiation completed 07/21/2017  (4) anastrozole started 07/24/2017  (a) bone density 10/03/2017 showed a T score of -0.8 (normal).   PLAN: Erlean is now 2 years out from definitive surgery for her breast cancer with no evidence of disease recurrence.  This is very favorable.  She is tolerating anastrozole well and the plan will be to continue that a minimum of 5 years.  I have encouraged her to get her vaccine as soon as she can possibly get it.  Once she has that she is planning to take several trips in her truck.  At this point I feel  comfortable seeing her on a once a year basis.  We will do a bone density at the same time as her mammogram in March  2022  Total encounter time 20 minutes.*  Davione Lenker, Virgie Dad, MD  04/15/19 11:06 AM Medical Oncology and Hematology [facility] [place], [place] [address] Tel. [phone]    Fax. [phone]   I, Wilburn Mylar, am acting as scribe for Dr. Virgie Dad. Lisett Dirusso.  I, Lurline Del MD, have reviewed the above documentation for accuracy and completeness, and I agree with the above.    *Total Encounter Time as defined by the Centers for Medicare and Medicaid Services includes, in addition to the face-to-face time of a patient visit (documented in the note above) non-face-to-face time: obtaining and reviewing outside history, ordering and reviewing medications, tests or procedures, care coordination (communications with other health care professionals or caregivers) and documentation in the medical record.

## 2019-04-15 ENCOUNTER — Other Ambulatory Visit: Payer: Self-pay

## 2019-04-15 ENCOUNTER — Inpatient Hospital Stay: Payer: BC Managed Care – PPO | Attending: Oncology | Admitting: Oncology

## 2019-04-15 ENCOUNTER — Inpatient Hospital Stay: Payer: BC Managed Care – PPO

## 2019-04-15 VITALS — BP 166/66 | HR 56 | Temp 97.8°F | Resp 18 | Ht 68.0 in | Wt 250.5 lb

## 2019-04-15 DIAGNOSIS — C50312 Malignant neoplasm of lower-inner quadrant of left female breast: Secondary | ICD-10-CM

## 2019-04-15 DIAGNOSIS — Z170 Estrogen receptor positive status [ER+]: Secondary | ICD-10-CM | POA: Insufficient documentation

## 2019-04-15 LAB — CBC WITH DIFFERENTIAL/PLATELET
Abs Immature Granulocytes: 0.02 10*3/uL (ref 0.00–0.07)
Basophils Absolute: 0.1 10*3/uL (ref 0.0–0.1)
Basophils Relative: 1 %
Eosinophils Absolute: 0.1 10*3/uL (ref 0.0–0.5)
Eosinophils Relative: 2 %
HCT: 37.1 % (ref 36.0–46.0)
Hemoglobin: 12.2 g/dL (ref 12.0–15.0)
Immature Granulocytes: 0 %
Lymphocytes Relative: 30 %
Lymphs Abs: 1.6 10*3/uL (ref 0.7–4.0)
MCH: 32.4 pg (ref 26.0–34.0)
MCHC: 32.9 g/dL (ref 30.0–36.0)
MCV: 98.7 fL (ref 80.0–100.0)
Monocytes Absolute: 0.4 10*3/uL (ref 0.1–1.0)
Monocytes Relative: 7 %
Neutro Abs: 3.1 10*3/uL (ref 1.7–7.7)
Neutrophils Relative %: 60 %
Platelets: 209 10*3/uL (ref 150–400)
RBC: 3.76 MIL/uL — ABNORMAL LOW (ref 3.87–5.11)
RDW: 12.7 % (ref 11.5–15.5)
WBC: 5.2 10*3/uL (ref 4.0–10.5)
nRBC: 0 % (ref 0.0–0.2)

## 2019-04-15 LAB — COMPREHENSIVE METABOLIC PANEL
ALT: 13 U/L (ref 0–44)
AST: 14 U/L — ABNORMAL LOW (ref 15–41)
Albumin: 3.7 g/dL (ref 3.5–5.0)
Alkaline Phosphatase: 62 U/L (ref 38–126)
Anion gap: 10 (ref 5–15)
BUN: 14 mg/dL (ref 8–23)
CO2: 26 mmol/L (ref 22–32)
Calcium: 9.3 mg/dL (ref 8.9–10.3)
Chloride: 107 mmol/L (ref 98–111)
Creatinine, Ser: 0.91 mg/dL (ref 0.44–1.00)
GFR calc Af Amer: >60 mL/min (ref >60)
GFR calc non Af Amer: >60 mL/min (ref >60)
Glucose, Bld: 123 mg/dL — ABNORMAL HIGH (ref 70–99)
Potassium: 3.9 mmol/L (ref 3.5–5.1)
Sodium: 143 mmol/L (ref 135–145)
Total Bilirubin: 2.1 mg/dL — ABNORMAL HIGH (ref 0.3–1.2)
Total Protein: 6.8 g/dL (ref 6.5–8.1)

## 2019-04-17 ENCOUNTER — Telehealth: Payer: Self-pay | Admitting: Oncology

## 2019-04-17 NOTE — Telephone Encounter (Signed)
Scheduled appt per 3/22 los. Was not able to leave a voicemail. Mailed pt a reminder letter and calendar.

## 2019-06-21 ENCOUNTER — Other Ambulatory Visit: Payer: Self-pay | Admitting: Oncology

## 2019-07-18 ENCOUNTER — Other Ambulatory Visit: Payer: Self-pay | Admitting: Oncology

## 2019-07-18 DIAGNOSIS — Z1231 Encounter for screening mammogram for malignant neoplasm of breast: Secondary | ICD-10-CM

## 2019-08-03 ENCOUNTER — Other Ambulatory Visit: Payer: Self-pay

## 2019-08-03 ENCOUNTER — Ambulatory Visit
Admission: EM | Admit: 2019-08-03 | Discharge: 2019-08-03 | Disposition: A | Discharge status: Home / Self Care | Payer: Medicare Other | Attending: Emergency Medicine | Admitting: Emergency Medicine

## 2019-08-03 ENCOUNTER — Encounter: Payer: Self-pay | Admitting: Emergency Medicine

## 2019-08-03 DIAGNOSIS — J0100 Acute maxillary sinusitis, unspecified: Secondary | ICD-10-CM

## 2019-08-03 DIAGNOSIS — R059 Cough, unspecified: Secondary | ICD-10-CM

## 2019-08-03 MED ORDER — DOXYCYCLINE HYCLATE 100 MG PO CAPS: 100.0000 mg | ORAL_CAPSULE | Freq: Two times a day (BID) | ORAL | 0 refills | Status: DC

## 2019-08-03 MED ORDER — BENZONATATE 100 MG PO CAPS: 100.0000 mg | ORAL_CAPSULE | Freq: Three times a day (TID) | ORAL | 0 refills | Status: DC

## 2019-08-03 NOTE — Discharge Instructions (Signed)
Get plenty of rest and push fluids Tessalon Perles prescribed for cough Doxycycline prescribed.  Take as directed and to completion Use OTC zyrtec for nasal congestion, runny nose, and/or sore throat Use OTC flonase for nasal congestion and runny nose Use medications daily for symptom relief Use OTC medications like ibuprofen or tylenol as needed fever or pain Call or go to the ED if you have any new or worsening symptoms such as fever, worsening cough, shortness of breath, chest tightness, chest pain, turning blue, changes in mental status, etc..Marland Kitchen

## 2019-08-03 NOTE — ED Provider Notes (Signed)
[facility]   [id number] 08/03/19 Arrival Time: 1003   CC: URI  SUBJECTIVE: History from: patient.  Meredith Donaldson is a 64 y.o. female who presents with maxillary sinus pain, pressure, nasal congestion, runny nose, PND, and cough x 3-4 days.  Fiances with similar symptoms.  Has tried OTC medications without relief.  Symptoms are made worse at night.  Report previous symptoms in the past.   Denies fever, chills, SOB, wheezing, chest pain, nausea, changes in bowel or bladder habits.    Had both COVID 19 vaccines  ROS: As per HPI.  All other pertinent ROS negative.     Past Medical History:  Diagnosis Date  . Cancer ([facility])   . Cataracts, bilateral   . Complication of anesthesia   . Hyperlipidemia   . Hypertension   . Personal history of radiation therapy    left breast 2019  . PONV (postoperative nausea and vomiting)    Past Surgical History:  Procedure Laterality Date  . BREAST BIOPSY Left 03/28/2017  . BREAST BIOPSY Left 04/13/2017  . BREAST LUMPECTOMY Left 05/10/2017  . BREAST LUMPECTOMY WITH RADIOACTIVE SEED AND SENTINEL LYMPH NODE BIOPSY Left 05/10/2017   Procedure: BREAST LUMPECTOMY WITH RADIOACTIVE SEED X2 AND SENTINEL LYMPH NODE BIOPSY;  Surgeon: Fanny Skates, MD;  Location: [facility];  Service: General;  Laterality: Left;  . CESAREAN SECTION    . FRACTURE SURGERY Right 2008   right leg fracture s/p pinning   Allergies  Allergen Reactions  . Penicillins Rash and Other (See Comments)    Has patient had a PCN reaction causing immediate rash, facial/tongue/throat swelling, SOB or lightheadedness with hypotension: Unknown Has patient had a PCN reaction causing severe rash involving mucus membranes or skin necrosis: Unknown Has patient had a PCN reaction that required hospitalization: Unknown Has patient had a PCN reaction occurring within the last 10 years: No If all of the above answers are "NO", then may proceed with Cephalosporin use.   . Sulfa  Antibiotics Rash   No current facility-administered medications on file prior to encounter.   Current Outpatient Medications on File Prior to Encounter  Medication Sig Dispense Refill  . acetaminophen (TYLENOL) 500 MG tablet Take 1,000 mg by mouth every 6 (six) hours as needed for moderate pain or headache.     . anastrozole (ARIMIDEX) 1 MG tablet Take 1 tablet by mouth once daily 90 tablet 0  . bisoprolol-hydrochlorothiazide (ZIAC) 10-6.25 MG tablet Take 1 tablet by mouth daily.  1  . Omega-3 Fatty Acids (FISH OIL) 1200 MG CAPS Take 2,400 mg by mouth daily before lunch.     . vitamin C (ASCORBIC ACID) 500 MG tablet Take 500 mg by mouth daily before lunch.     . vitamin E 400 UNIT capsule Take 400 Units by mouth daily before lunch.     Social History   Socioeconomic History  . Marital status: Widowed    Spouse name: Not on file  . Number of children: Not on file  . Years of education: Not on file  . Highest education level: Not on file  Occupational History  . Not on file  Tobacco Use  . Smoking status: Never Smoker  . Smokeless tobacco: Never Used  Vaping Use  . Vaping Use: Never used  Substance and Sexual Activity  . Alcohol use: No  . Drug use: No  . Sexual activity: Yes  Other Topics Concern  . Not on file  Social History Narrative  . Not on  file   Social Determinants of Health   Financial Resource Strain:   . Difficulty of Paying Living Expenses:   Food Insecurity:   . Worried About Charity fundraiser in the Last Year:   . Arboriculturist in the Last Year:   Transportation Needs:   . Film/video editor (Medical):   Marland Kitchen Lack of Transportation (Non-Medical):   Physical Activity:   . Days of Exercise per Week:   . Minutes of Exercise per Session:   Stress:   . Feeling of Stress :   Social Connections:   . Frequency of Communication with Friends and Family:   . Frequency of Social Gatherings with Friends and Family:   . Attends Religious Services:   .  Active Member of Clubs or Organizations:   . Attends Archivist Meetings:   Marland Kitchen Marital Status:   Intimate Partner Violence:   . Fear of Current or Ex-Partner:   . Emotionally Abused:   Marland Kitchen Physically Abused:   . Sexually Abused:    Family History  Problem Relation Age of Onset  . Heart attack Father   . Breast cancer Cousin        maternal first cousin  . Breast cancer Cousin        maternal first cousin  . Cancer Neg Hx     OBJECTIVE:  Vitals:   08/03/19 1102 08/03/19 1104  BP:  (!) 163/82  Pulse:  64  Resp:  18  Temp:  98.5 F (36.9 C)  TempSrc:  Oral  SpO2:  97%  Weight: 240 lb (108.9 kg)      General appearance: alert; appears fatigued, but nontoxic; speaking in full sentences and tolerating own secretions HEENT: NCAT; Ears: EACs clear, TMs pearly gray; Eyes: PERRL.  EOM grossly intact. Sinuses: TTP over maxillary sinus; Nose: nares patent without rhinorrhea, Throat: oropharynx clear, tonsils non erythematous or enlarged, uvula midline  Neck: supple without LAD Lungs: unlabored respirations, symmetrical air entry; cough: absent; no respiratory distress; CTAB Heart: regular rate and rhythm.   Skin: warm and dry Psychological: alert and cooperative; normal mood and affect  ASSESSMENT & PLAN:  1. Acute non-recurrent maxillary sinusitis   2. Cough     Meds ordered this encounter  Medications  . doxycycline (VIBRAMYCIN) 100 MG capsule    Sig: Take 1 capsule (100 mg total) by mouth 2 (two) times daily.    Dispense:  20 capsule    Refill:  0    Order Specific Question:   Supervising Provider    Answer:   Raylene Everts [[id number]]  . benzonatate (TESSALON) 100 MG capsule    Sig: Take 1 capsule (100 mg total) by mouth every 8 (eight) hours.    Dispense:  21 capsule    Refill:  0    Order Specific Question:   Supervising Provider    Answer:   Raylene Everts [[id number]]    Get plenty of rest and push fluids Tessalon Perles prescribed for  cough Doxycycline prescribed.  Take as directed and to completion Use OTC zyrtec for nasal congestion, runny nose, and/or sore throat Use OTC flonase for nasal congestion and runny nose Use medications daily for symptom relief Use OTC medications like ibuprofen or tylenol as needed fever or pain Call or go to the ED if you have any new or worsening symptoms such as fever, worsening cough, shortness of breath, chest tightness, chest pain, turning blue, changes in mental status, etc..Marland Kitchen  Reviewed expectations re: course of current medical issues. Questions answered. Outlined signs and symptoms indicating need for more acute intervention. Patient verbalized understanding. After Visit Summary given.         Lestine Box, PA-C 08/03/19 1119

## 2019-08-03 NOTE — ED Triage Notes (Signed)
Nasal congestion, chest congestion, ears hurt, scratchy throat, but no fever. Both COVID vaccines.

## 2019-09-23 ENCOUNTER — Other Ambulatory Visit: Payer: Self-pay | Admitting: Oncology

## 2019-11-14 IMAGING — MR MR BILATERAL BREAST WITHOUT AND WITH CONTRAST
7 of 11 series · 29 of 48 positions shown · IV contrast (20ml Multihance)
Comparison: Previous exam(s).

ADDENDUM:
Upon further review and comparison of the diagnostic mammograms and
the post clip mammograms following the ultrasound-guided biopsy of
the left breast, I believe that the biopsied mass is actually the
more posterior of the 2 enhancing left breast masses, the 1 near the
inframammary fold. On the post clip mL mammogram, the more anterior
mass does not contain a clip. The more anterior mass, which is the
more intensely enhancing of the 2, does not appear to have been
sampled. It would still be reasonable to biopsy the more anterior
non mass enhancement. If this is carcinoma, then seed placement
adjacent to each biopsy clip would allow the 2 masses and entire
span of abnormal enhancement to be included in the lumpectomy
specimen. If this is not carcinoma, then the more anterior mass
could either be biopsied under MRI guidance or removed at the time
of lumpectomy by placing the seed between the 2 masses.
CLINICAL DATA: New diagnosis of left breast carcinoma. Patient had
1 small posterior, lower inner quadrant mass biopsied under
ultrasound guidance, which revealed invasive ductal carcinoma with
papillary features, grade 2. Patient had a second adjacent mass on
the diagnostic mammography. Current breast MRI is to evaluate for
extent of disease.

LABS:  Creatinine was obtained on site at [HOSPITAL] at [REDACTED] [HOSPITAL].
Results: Creatinine 0.8 mg/dL.  BUN 12.  GFR 79.
EXAM:
BILATERAL BREAST MRI WITH AND WITHOUT CONTRAST
TECHNIQUE: Multiplanar, multisequence MR images of both breasts were obtained
prior to and following the intravenous administration of 20 ml of
MultiHance.

[Series 2: t2_tirm_tra ipat (a-p) · axial · 3.0mm · 0.74mm/px · 1 of 55 slices shown]
[im 1/55]
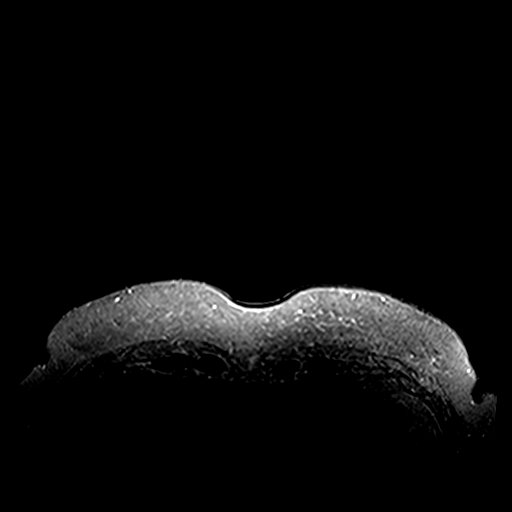

[Series 3: fl3d pre-cm no · axial · non-contrast · 1.2mm · 0.99mm/px · z∈[-80,+91]mm · 5 of 144 slices shown]
[im 1/144]
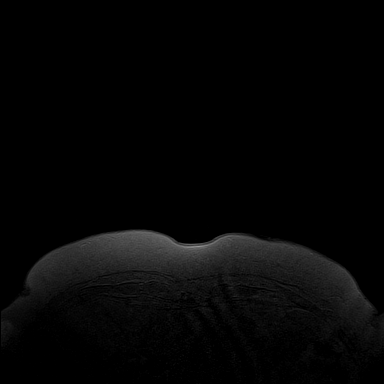
[im 36/144]
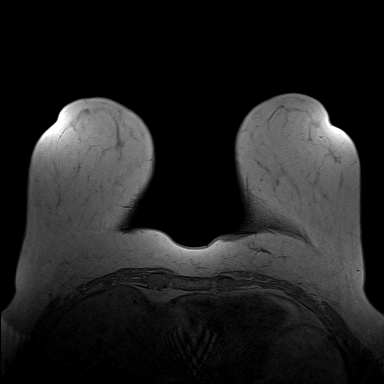
[im 72/144]
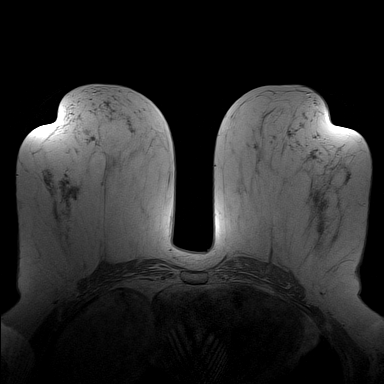
[im 108/144]
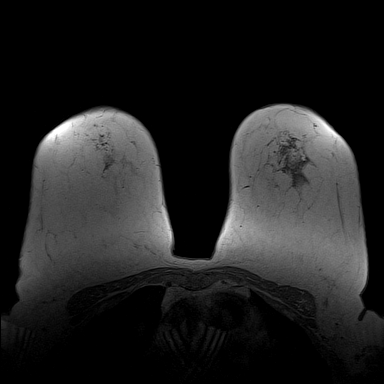
[im 144/144]
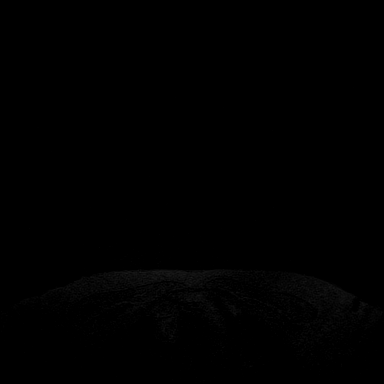

[Series 4: fl3d pre-cm · axial · non-contrast · 1.2mm · 0.99mm/px · z∈[-80,+91]mm · 5 of 144 slices shown]
[im 1/144]
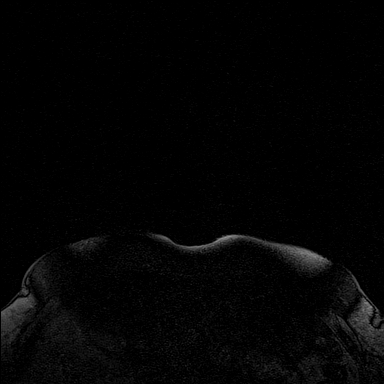
[im 36/144]
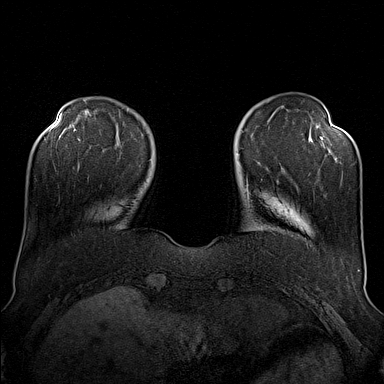
[im 72/144]
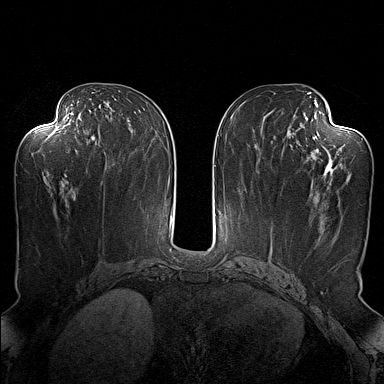
[im 108/144]
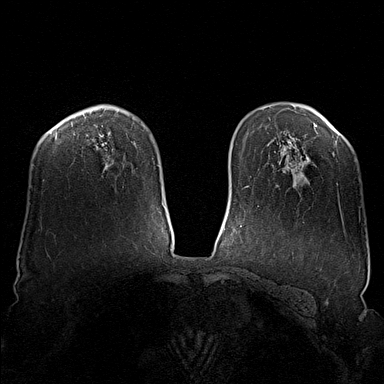
[im 144/144]
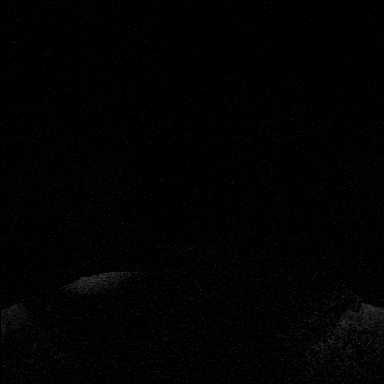

[Series 5: fl3d post-cm 20 · axial · 1.2mm · 0.99mm/px · z∈[-80,+91]mm · 5 of 144 slices shown (1 of 3)]
[im 1/144]
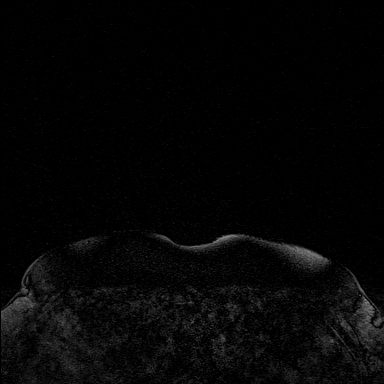
[im 36/144]
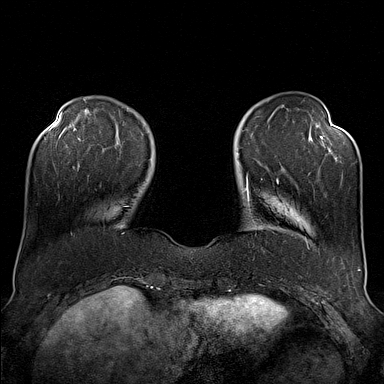
[im 72/144]
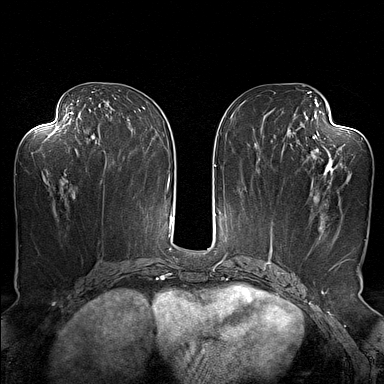
[im 108/144]
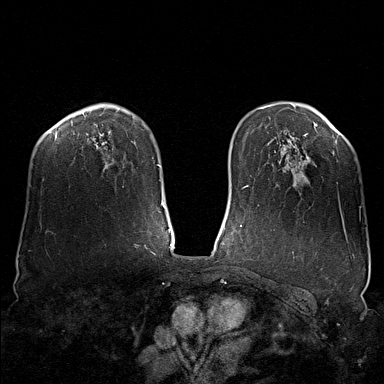
[im 144/144]
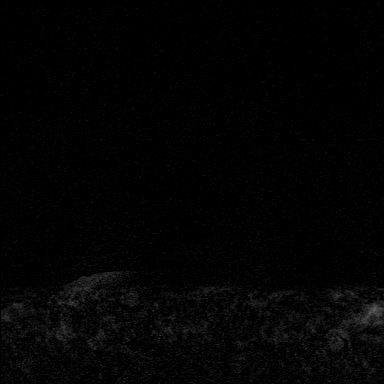

[Series 6: fl3d post-cm 20 · axial · 1.2mm · 0.99mm/px · z∈[-80,+91]mm · 6 of 144 slices shown (2 of 3)]
[im 1/144]
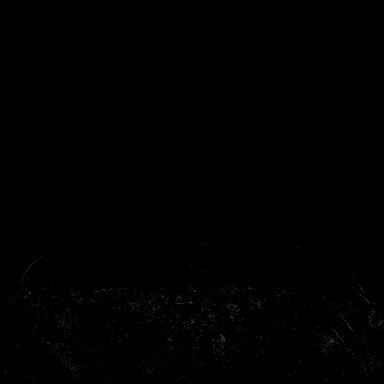
[im 29/144]
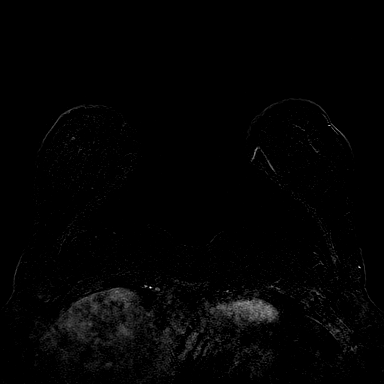
[im 58/144]
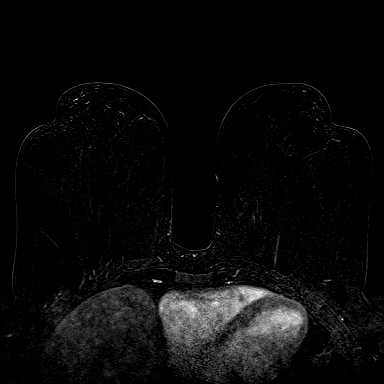
[im 86/144]
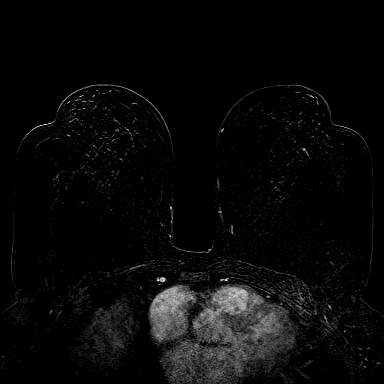
[im 115/144]
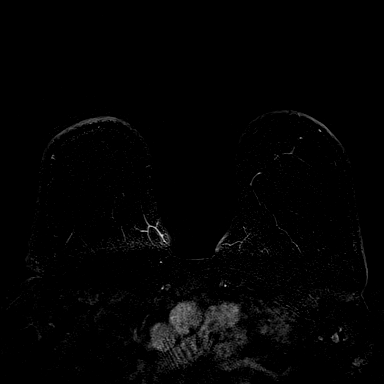
[im 144/144]
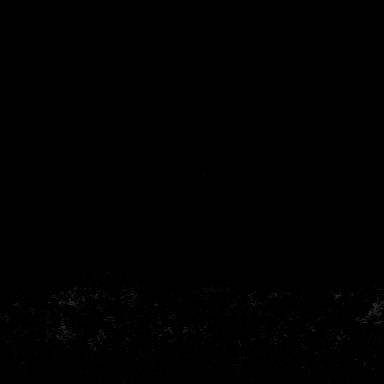

[Series 7: fl3d post-cm 20 · axial · 172.8mm · 0.99mm/px · 1 of 1 slices shown (3 of 3)]
[im 1/1]
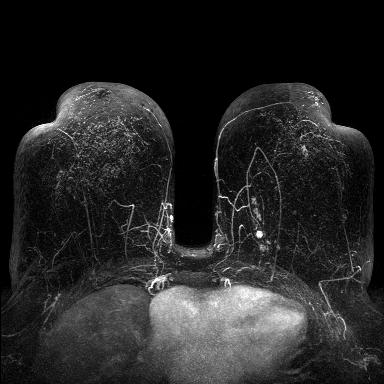

[Series 8: fl3d post-cm 3min · axial · 1.2mm · 0.99mm/px · z∈[-80,+91]mm · 6 of 144 slices shown]
[im 1/144]
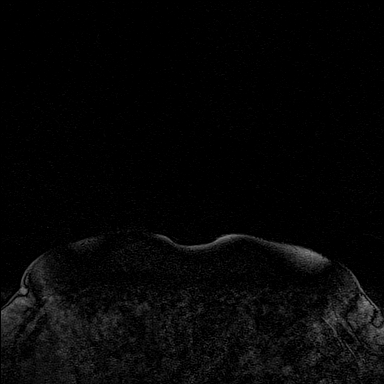
[im 29/144]
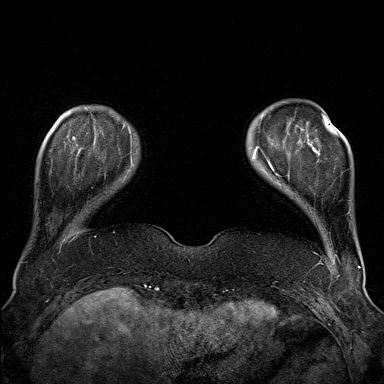
[im 58/144]
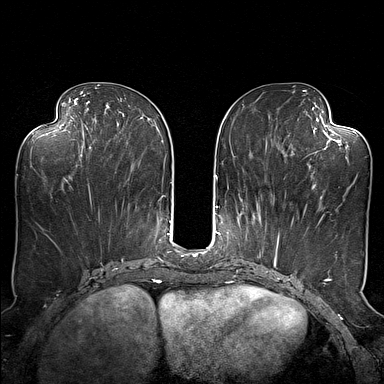
[im 86/144]
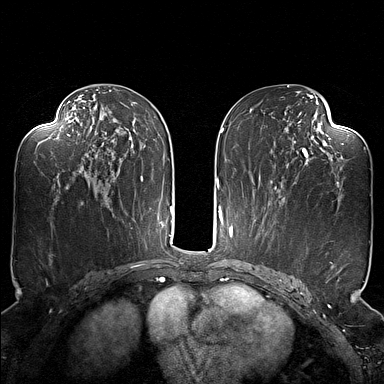
[im 115/144]
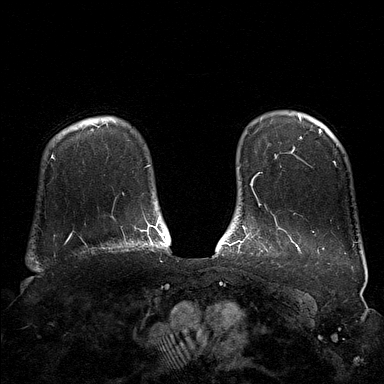
[im 144/144]
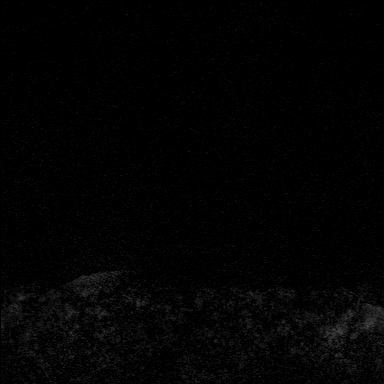

[29 of 48 positions shown; findings below may reference images not displayed]

THREE-DIMENSIONAL MR IMAGE RENDERING ON INDEPENDENT WORKSTATION:

Three-dimensional MR images were rendered by post-processing of the
original MR data on an independent workstation. The
three-dimensional MR images were interpreted, and findings are
reported in the following complete MRI report for this study. Three
dimensional images were evaluated at the independent [employer]
workstation
FINDINGS: Breast composition: b. Scattered fibroglandular tissue.

Background parenchymal enhancement: Minimal

Right breast: No mass or abnormal enhancement.

Left breast: There are 2 enhancing masses in the posterior, lower
inner quadrant of the left breast. The brightest, which shows rapid
wash-in and washout kinetics, measures 7 mm. This appears to be the
mass biopsied under ultrasound guidance, surrounded by a rim of dark
signal consistent with a small amount of post biopsy hemorrhage.
Artifact from what appears to be the clip lies just posterior and
inferior to this. There is a second similar sized, but less
intensely enhancing mass, more posteriorly, near the inframammary
fold, with moderate wash-in and washout kinetics.

There is also linearly arranged non masslike enhancement that
extends anterior to the biopsied mass posteriorly and inferiorly to
the second mass, covering 5.4 cm from anterior to posterior. Much of
this is associated with low T1 signal suggesting areas of post
biopsy hemorrhage.

Lymph nodes: No abnormal appearing lymph nodes.

Ancillary findings:  None.
IMPRESSION: 1. 7 mm enhancing mass in the posterior lower inner quadrant of the
left breast reflecting the biopsy proven breast carcinoma. Just
posterior and slightly inferior to this, there is a second less
intensely enhancing mass, also 7 mm, with approximately 2.2 cm
separating the 2 masses. Between these 2 masses and extending
anteriorly to the biopsy proven breast carcinoma, there is linearly
arranged non masslike enhancement spanning 5.4 cm from anterior to
posterior. This may all represent breast carcinoma. Some of this may
be enhancement along the biopsy tract.
2. There are no other areas of abnormal enhancement in the left
breast.
3. No evidence of malignancy in the right breast.
4. No abnormal lymph nodes.

RECOMMENDATION:
1. Given that the 2 enhancing left breast masses are in close
proximity, and that both well seen mammographically, lumpectomy
following a radioactive seed placed along the posterior margin of
the biopsy proven carcinoma should allow both of these lesions to be
removed.
2. Consider MRI guided biopsy of the non masslike enhancement
extending anteriorly to the biopsy-proven carcinoma if this would
alter management.

BI-RADS CATEGORY  6: Known biopsy-proven malignancy.

## 2019-12-14 ENCOUNTER — Other Ambulatory Visit: Payer: Self-pay

## 2019-12-14 ENCOUNTER — Ambulatory Visit
Admission: EM | Admit: 2019-12-14 | Discharge: 2019-12-14 | Disposition: A | Discharge status: Home / Self Care | Payer: Medicare Other | Attending: Emergency Medicine | Admitting: Emergency Medicine

## 2019-12-14 ENCOUNTER — Encounter: Payer: Self-pay | Admitting: Emergency Medicine

## 2019-12-14 DIAGNOSIS — J029 Acute pharyngitis, unspecified: Secondary | ICD-10-CM | POA: Diagnosis not present

## 2019-12-14 DIAGNOSIS — Z1152 Encounter for screening for COVID-19: Secondary | ICD-10-CM | POA: Diagnosis not present

## 2019-12-14 DIAGNOSIS — J069 Acute upper respiratory infection, unspecified: Secondary | ICD-10-CM | POA: Diagnosis not present

## 2019-12-14 LAB — POCT RAPID STREP A (OFFICE): Rapid Strep A Screen: NEGATIVE

## 2019-12-14 MED ORDER — FLUTICASONE PROPIONATE 50 MCG/ACT NA SUSP: 1.0000 | Freq: Every day | NASAL | 0 refills | Status: DC

## 2019-12-14 MED ORDER — BENZONATATE 100 MG PO CAPS: 100.0000 mg | ORAL_CAPSULE | Freq: Three times a day (TID) | ORAL | 0 refills | Status: DC

## 2019-12-14 MED ORDER — CETIRIZINE HCL 10 MG PO TABS: 10.0000 mg | ORAL_TABLET | Freq: Every day | ORAL | 0 refills | Status: DC

## 2019-12-14 MED ORDER — LIDOCAINE VISCOUS HCL 2 % MT SOLN: 15.0000 mL | OROMUCOSAL | 1 refills | Status: DC | PRN

## 2019-12-14 MED ORDER — DEXAMETHASONE 4 MG PO TABS: 4.0000 mg | ORAL_TABLET | Freq: Every day | ORAL | 0 refills | Status: AC

## 2019-12-14 NOTE — ED Triage Notes (Signed)
Patient states that she woke up with sore throat- sinus drainage.

## 2019-12-14 NOTE — Discharge Instructions (Signed)
Strep test negative, will send out for culture and we will call you with results Viscous lidocaine prescribed.  This is an oral solution you can swish, and gargle as needed for symptomatic relief of sore throat.  Do not exceed 8 doses in a 24 hour period.  Do not use prior to eating, as this will numb your entire mouth.   Drink warm or cool liquids, use throat lozenges, or popsicles to help alleviate symptoms Take OTC ibuprofen or tylenol as needed for pain Follow up with PCP if symptoms persists Return or go to ER if patient has any new or worsening symptoms such as fever, chills, nausea, vomiting, worsening sore throat, cough, abdominal pain, chest pain, changes in bowel or bladder habits, etc...  COVID testing ordered.  It will take between 2-7 days for test results.  Someone will contact you regarding abnormal results.    Tessalon Perles prescribed for cough Zyrtec for nasal congestion, runny nose, and/or sore throat Flonase for nasal congestion and runny nose Decadron was prescribed Use medications daily for symptom relief Use OTC medications like ibuprofen or tylenol as needed fever or pain Call or go to the ED if you have any new or worsening symptoms such as fever, worsening cough, shortness of breath, chest tightness, chest pain, turning blue, changes in mental status, etc..Marland Kitchen

## 2019-12-14 NOTE — ED Provider Notes (Signed)
[facility]   [id number] 12/14/19 Arrival Time: 4235  TI:RWER THROAT  SUBJECTIVE: History from: patient.  Meredith PORCELLI is a 64 y.o. female who presented to the urgent care for complaint of sore throat that is getting worse this morning, mild cough and congestion for the past 3 days.  Denies sick exposure to strep, flu or mono, or precipitating event.  Has tried OTC medication without relief.  Symptoms are made worse with swallowing, but tolerating liquids and own secretions without difficulty.  Denies previous symptoms in the past.   Denies fever, chills, fatigue, ear pain, sinus pain, rhinorrhea,  SOB, wheezing, chest pain, nausea, rash, changes in bowel or bladder habits.    ROS: As per HPI.  All other pertinent ROS negative.     Past Medical History:  Diagnosis Date   Cancer ([place])    Cataracts, bilateral    Complication of anesthesia    Hyperlipidemia    Hypertension    Personal history of radiation therapy    left breast 2019   PONV (postoperative nausea and vomiting)    Past Surgical History:  Procedure Laterality Date   BREAST BIOPSY Left 03/28/2017   BREAST BIOPSY Left 04/13/2017   BREAST LUMPECTOMY Left 05/10/2017   BREAST LUMPECTOMY WITH RADIOACTIVE SEED AND SENTINEL LYMPH NODE BIOPSY Left 05/10/2017   Procedure: BREAST LUMPECTOMY WITH RADIOACTIVE SEED X2 AND SENTINEL LYMPH NODE BIOPSY;  Surgeon: Fanny Skates, MD;  Location: [facility];  Service: General;  Laterality: Left;   CESAREAN SECTION     FRACTURE SURGERY Right 2008   right leg fracture s/p pinning   Allergies  Allergen Reactions   Penicillins Rash and Other (See Comments)    Has patient had a PCN reaction causing immediate rash, facial/tongue/throat swelling, SOB or lightheadedness with hypotension: Unknown Has patient had a PCN reaction causing severe rash involving mucus membranes or skin necrosis: Unknown Has patient had a PCN reaction that required hospitalization: Unknown Has  patient had a PCN reaction occurring within the last 10 years: No If all of the above answers are "NO", then may proceed with Cephalosporin use.    Sulfa Antibiotics Rash   No current facility-administered medications on file prior to encounter.   Current Outpatient Medications on File Prior to Encounter  Medication Sig Dispense Refill   acetaminophen (TYLENOL) 500 MG tablet Take 1,000 mg by mouth every 6 (six) hours as needed for moderate pain or headache.      anastrozole (ARIMIDEX) 1 MG tablet Take 1 tablet by mouth once daily 90 tablet 3   bisoprolol-hydrochlorothiazide (ZIAC) 10-6.25 MG tablet Take 1 tablet by mouth daily.  1   doxycycline (VIBRAMYCIN) 100 MG capsule Take 1 capsule (100 mg total) by mouth 2 (two) times daily. 20 capsule 0   Omega-3 Fatty Acids (FISH OIL) 1200 MG CAPS Take 2,400 mg by mouth daily before lunch.      vitamin C (ASCORBIC ACID) 500 MG tablet Take 500 mg by mouth daily before lunch.      vitamin E 400 UNIT capsule Take 400 Units by mouth daily before lunch.     Social History   Socioeconomic History   Marital status: Widowed    Spouse name: Not on file   Number of children: Not on file   Years of education: Not on file   Highest education level: Not on file  Occupational History   Not on file  Tobacco Use   Smoking status: Never Smoker   Smokeless tobacco: Never  Used  Vaping Use   Vaping Use: Never used  Substance and Sexual Activity   Alcohol use: No   Drug use: No   Sexual activity: Yes  Other Topics Concern   Not on file  Social History Narrative   Not on file   Social Determinants of Health   Financial Resource Strain:    Difficulty of Paying Living Expenses: Not on file  Food Insecurity:    Worried About Charity fundraiser in the Last Year: Not on file   [employer] of Food in the Last Year: Not on file  Transportation Needs:    Lack of Transportation (Medical): Not on file   Lack of Transportation  (Non-Medical): Not on file  Physical Activity:    Days of Exercise per Week: Not on file   Minutes of Exercise per Session: Not on file  Stress:    Feeling of Stress : Not on file  Social Connections:    Frequency of Communication with Friends and Family: Not on file   Frequency of Social Gatherings with Friends and Family: Not on file   Attends Religious Services: Not on file   Active Member of Clubs or Organizations: Not on file   Attends Archivist Meetings: Not on file   Marital Status: Not on file  Intimate Partner Violence:    Fear of Current or Ex-Partner: Not on file   Emotionally Abused: Not on file   Physically Abused: Not on file   Sexually Abused: Not on file   Family History  Problem Relation Age of Onset   Heart attack Father    Breast cancer Cousin        maternal first cousin   Breast cancer Cousin        maternal first cousin   Cancer Neg Hx     OBJECTIVE:  Vitals:   12/14/19 0901  BP: (!) 172/84  Pulse: 72  Resp: 16  Temp: 98.7 F (37.1 C)  SpO2: 98%     General appearance: alert; appears fatigued, but nontoxic, speaking in full sentences and managing own secretions HEENT: NCAT; Ears: EACs clear, TMs pearly gray with visible cone of light, without erythema; Eyes: PERRL, EOMI grossly; Nose: no obvious rhinorrhea; Throat: oropharynx clear, tonsils 1+ and mildly erythematous without white tonsillar exudates, uvula midline Neck: supple without LAD Lungs: CTA bilaterally without adventitious breath sounds; cough present Heart: regular rate and rhythm.  Radial pulses 2+ symmetrical bilaterally Skin: warm and dry Psychological: alert and cooperative; normal mood and affect  LABS: Results for orders placed or performed during the hospital encounter of 12/14/19 (from the past 24 hour(s))  POCT rapid strep A     Status: None   Collection Time: 12/14/19  9:19 AM  Result Value Ref Range   Rapid Strep A Screen Negative Negative       ASSESSMENT & PLAN:  1. URI with cough and congestion   2. Sore throat   3. Encounter for screening for COVID-19     Meds ordered this encounter  Medications   lidocaine (XYLOCAINE) 2 % solution    Sig: Use as directed 15 mLs in the mouth or throat as needed for mouth pain.    Dispense:  100 mL    Refill:  1   fluticasone (FLONASE) 50 MCG/ACT nasal spray    Sig: Place 1 spray into both nostrils daily for 7 days.    Dispense:  16 g    Refill:  0  cetirizine (ZYRTEC ALLERGY) 10 MG tablet    Sig: Take 1 tablet (10 mg total) by mouth daily.    Dispense:  30 tablet    Refill:  0   benzonatate (TESSALON) 100 MG capsule    Sig: Take 1 capsule (100 mg total) by mouth every 8 (eight) hours.    Dispense:  30 capsule    Refill:  0   dexamethasone (DECADRON) 4 MG tablet    Sig: Take 1 tablet (4 mg total) by mouth daily for 7 days.    Dispense:  7 tablet    Refill:  0    Discharge instructions  Strep test negative, will send out for culture and we will call you with results Viscous lidocaine prescribed.  This is an oral solution you can swish, and gargle as needed for symptomatic relief of sore throat.  Do not exceed 8 doses in a 24 hour period.  Do not use prior to eating, as this will numb your entire mouth.   Drink warm or cool liquids, use throat lozenges, or popsicles to help alleviate symptoms Take OTC ibuprofen or tylenol as needed for pain Follow up with PCP if symptoms persists Return or go to ER if patient has any new or worsening symptoms such as fever, chills, nausea, vomiting, worsening sore throat, cough, abdominal pain, chest pain, changes in bowel or bladder habits, etc...  COVID testing ordered.  It will take between 2-7 days for test results.  Someone will contact you regarding abnormal results.    Tessalon Perles prescribed for cough Zyrtec for nasal congestion, runny nose, and/or sore throat Flonase for nasal congestion and runny nose Decadron was  prescribed Use medications daily for symptom relief Use OTC medications like ibuprofen or tylenol as needed fever or pain Call or go to the ED if you have any new or worsening symptoms such as fever, worsening cough, shortness of breath, chest tightness, chest pain, turning blue, changes in mental status, etc...   Reviewed expectations re: course of current medical issues. Questions answered. Outlined signs and symptoms indicating need for more acute intervention. Patient verbalized understanding. After Visit Summary given.         Emerson Monte, [place] 12/14/19 [phone]

## 2019-12-15 LAB — SARS-COV-2, NAA 2 DAY TAT

## 2019-12-15 LAB — NOVEL CORONAVIRUS, NAA: SARS-CoV-2, NAA: NOT DETECTED

## 2019-12-17 LAB — CULTURE, GROUP A STREP (THRC): Special Requests: NORMAL

## 2020-04-03 ENCOUNTER — Other Ambulatory Visit: Payer: Self-pay | Admitting: Oncology

## 2020-04-03 DIAGNOSIS — Z853 Personal history of malignant neoplasm of breast: Secondary | ICD-10-CM

## 2020-04-08 ENCOUNTER — Other Ambulatory Visit: Payer: Self-pay

## 2020-04-08 ENCOUNTER — Ambulatory Visit
Admission: RE | Admit: 2020-04-08 | Discharge: 2020-04-08 | Disposition: A | Discharge status: Home / Self Care | Payer: Medicare Other | Source: Ambulatory Visit | Attending: Oncology | Admitting: Oncology

## 2020-04-08 DIAGNOSIS — C50312 Malignant neoplasm of lower-inner quadrant of left female breast: Secondary | ICD-10-CM

## 2020-04-08 DIAGNOSIS — Z853 Personal history of malignant neoplasm of breast: Secondary | ICD-10-CM

## 2020-04-13 NOTE — Progress Notes (Signed)
[place]  [phone] [phone]) [phone]     ID: Meredith Donaldson DOB: Nov 08, 1955  MR#: [id number]  JGG#:[id number]  Patient Care Team: Scherrie Bateman as PCP - General (Family Medicine) Lavella Myren, Virgie Dad, MD as Consulting Physician (Oncology) Fanny Skates, MD as Consulting Physician (General Surgery) Kyung Rudd, MD as Consulting Physician (Radiation Oncology) Delice Bison, Charlestine Massed, NP as Nurse Practitioner (Hematology and Oncology) OTHER MD:   CHIEF COMPLAINT: Estrogen receptor positive breast cancer  CURRENT TREATMENT: Anastrozole   INTERVAL HISTORY: [name] returns today for follow-up of her estrogen receptor positive breast cancer.   She continues on anastrozole.  She continues to do very well with this, with no significant hot flashes or vaginal dryness issues and no arthralgias/myalgias.  Since her last visit, she underwent bone density screening at [facility] on 04/08/2020 showing a T-score of -1.1, which is minimally osteopenic.  This is slightly worse than 2 years prior  She also underwent bilateral diagnostic mammography with tomography the same day showing: breast density category B; no evidence of malignancy in either breast.    REVIEW OF SYSTEMS: [name] tells me she has lost 2 aunts within the last 2 months.  The one who just died had significant Alzheimer's disease and died from consequences of a fall.  She was 93.  [name] herself has some right knee issues.  She denies unusual headaches visual changes cough phlegm production pleurisy shortness of breath or change in bowel or bladder habits.  There is no are you unusual site of pain or persistent pain.  She generally feels healthy.  A detailed review of systems today was stable   COVID 19 VACCINATION STATUS: Status post [place] x2 with booster November 2021   HISTORY OF CURRENT ILLNESS: From the original intake note:  Meredith Donaldson had routine screening  mammography on 03/03/2017 showing a possible abnormality in the left breast. She underwent unilateral left diagnostic mammography with tomography and left breast ultrasonography at [facility] on 03/14/2017 showing: breast density category B. There are were multiple hypoechoic masses of varying sizes in the lower inner quadrant of the left breast found sonographically. A dominant mass at the 7:30 position measuring 0.9 x 0.9 x 0.8 cm near the inframammary fold. Another mass at the 7:30 slightly deep and lateral measuring 0.4 x 0.3 cm. At 8 o'clock position, near the medial margin of the breast is a second dominant mass measuring 0.8 x 0.8 x 0.9 cm. Slightly medial to this is a similar-appearing hypoechoic mass measuring 0.4 x 0.4 x 0.5 cm. Immediately lateral to the dominant mass at 8:00 position, located 12 cm from the nipple, is a 0.4 x 0.4 x 0.5 cm mass. Slightly more superficially at 8 o'clock position 12 cm from the nipple is an irregular hypoechoic mass measuring 0.7 cm maximum diameter. Ultrasound of the left axilla was negative for lymphadenopathy.   Accordingly on 03/28/2017 she proceeded to biopsy of the left breast area in question. The pathology from this procedure showed ([id number]): At the 8 o'clock position, invasive ductal carcinoma with papillary features. Prognostic indicators significant for: estrogen receptor, 100% positive and progesterone receptor, 100% positive, both with strong staining intensity. Proliferation marker Ki67 at 2%. HER2 not amplified with ratios HER2/CEP17 signals 1.13 and average HER2 copies per cell 1.80  On 04/10/2017 the patient had bilateral breast MRI, showing an area up to 5.4 cm including 2 separate masses and an area of non-masslike enhancement.  The mass that was  previously biopsied is the more posterior one, near the inframammary fold.  The more anterior mass has not been biopsied and if lumpectomy is considered biopsying that mass would allow the  entire span of abnormal enhancement to be included in the lumpectomy specimen.  This second MRI guided biopsy has been scheduled for 04/17/2017.  The right breast was unremarkable and there were no abnormal looking lymph nodes  The patient's subsequent history is as detailed below.   PAST MEDICAL HISTORY: Past Medical History:  Diagnosis Date  . Cancer ([place])   . Cataracts, bilateral   . Complication of anesthesia   . Hyperlipidemia   . Hypertension   . Personal history of radiation therapy    left breast 2019  . PONV (postoperative nausea and vomiting)     PAST SURGICAL HISTORY: Past Surgical History:  Procedure Laterality Date  . BREAST BIOPSY Left 03/28/2017  . BREAST BIOPSY Left 04/13/2017  . BREAST LUMPECTOMY Left 05/10/2017  . BREAST LUMPECTOMY WITH RADIOACTIVE SEED AND SENTINEL LYMPH NODE BIOPSY Left 05/10/2017   Procedure: BREAST LUMPECTOMY WITH RADIOACTIVE SEED X2 AND SENTINEL LYMPH NODE BIOPSY;  Surgeon: Fanny Skates, MD;  Location: [facility];  Service: General;  Laterality: Left;  . CESAREAN SECTION    . FRACTURE SURGERY Right 2008   right leg fracture s/p pinning    FAMILY HISTORY Family History  Problem Relation Age of Onset  . Heart attack Father   . Breast cancer Cousin        maternal first cousin  . Breast cancer Cousin        maternal first cousin  . Cancer Neg Hx    The patient's father died at age 86 due to several heart attacks and stokes. The patient's mother passed at age 66 due to old age. The patient has 4 brothers and 2 sisters. She denies a family history of breast or ovarian cancer.   GYNECOLOGIC HISTORY:  No LMP recorded. Patient is postmenopausal. Menarche: 65 years old Age at first live birth: 65 years old The patient is GX P2 LMP: at age 38 She took low dose oral contraceptive pills briefly after her 1st birth, with no complications. She had bilateral tubal ligation after her 2nd birth due to pregnancy complications. She retained her  ovaries. She did not take HRT.    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 Her husband passed away some years ago due to cancer (lymphoma). She lives with her significant other of 7+ years, [name], who is retired from being a Furniture conservator/restorer. [name] also helps with farm work. The patient's oldest son, [name] "Corene Cornea" Carelli is a Company secretary at [employer]. The patient's second son, "Christia Reading" Allysen Lazo, was a Engineer, building services but is now a Librarian, academic and lives near the patient's farm. The patient has 7 grandchildren.  She has two chihuahuas Romeo and [place] and a 120 pound [employer], that she obtained after 3 people broke into 1 of her trucks.  She also has multiple chickens and 4 peacocks. She does not attend a church.      ADVANCED DIRECTIVES:    HEALTH MAINTENANCE: Social History   Tobacco Use  . Smoking status: Never Smoker  . Smokeless tobacco: Never Used  Vaping Use  . Vaping Use: Never used  Substance Use Topics  . Alcohol use: No  . Drug use: No     Colonoscopy: never  PAP: [place]/ not UTD  Bone density: 09/2017, -0.8    Allergies  Allergen Reactions  . Penicillins Rash and Other (See Comments)    Has patient had a PCN reaction causing immediate rash, facial/tongue/throat swelling, SOB or lightheadedness with hypotension: Unknown Has patient had a PCN reaction causing severe rash involving mucus membranes or skin necrosis: Unknown Has patient had a PCN reaction that required hospitalization: Unknown Has patient had a PCN reaction occurring within the last 10 years: No If all of the above answers are "NO", then may proceed with Cephalosporin use.   . Sulfa Antibiotics Rash    Current Outpatient Medications  Medication Sig Dispense Refill  . cholecalciferol (VITAMIN D3) 25 MCG (1000 UNIT) tablet Take 1 tablet (1,000 Units total) by mouth daily. 100 tablet 3  . lisinopril (PRINIVIL) 10  MG tablet Take 1 tablet (10 mg total) by mouth daily.    Marland Kitchen acetaminophen (TYLENOL) 500 MG tablet Take 1,000 mg by mouth every 6 (six) hours as needed for moderate pain or headache.     . anastrozole (ARIMIDEX) 1 MG tablet Take 1 tablet (1 mg total) by mouth daily. 90 tablet 4  . bisoprolol-hydrochlorothiazide (ZIAC) 10-6.25 MG tablet Take 1 tablet by mouth daily.  1  . Omega-3 Fatty Acids (FISH OIL) 1200 MG CAPS Take 2,400 mg by mouth daily before lunch.     . vitamin C (ASCORBIC ACID) 500 MG tablet Take 500 mg by mouth daily before lunch.     . vitamin E 400 UNIT capsule Take 400 Units by mouth daily before lunch.     No current facility-administered medications for this visit.    OBJECTIVE: White woman who appears well  Vitals:   04/14/20 1048  BP: (!) 143/62  Pulse: (!) 59  Resp: 18  Temp: 97.7 F (36.5 C)  SpO2: 98%   Wt Readings from Last 3 Encounters:  04/14/20 237 lb 14.4 oz (107.9 kg)  08/03/19 240 lb (108.9 kg)  04/15/19 250 lb 8 oz (113.6 kg)   Body mass index is 36.17 kg/m.    ECOG FS:1 - Symptomatic but completely ambulatory  Sclerae unicteric, EOMs intact Wearing a mask No cervical or supraclavicular adenopathy Lungs no rales or rhonchi Heart regular rate and rhythm Abd soft, nontender, positive bowel sounds MSK no focal spinal tenderness, no upper extremity lymphedema Neuro: nonfocal, well oriented, appropriate affect Breasts: The right breast is benign.  The left breast has undergone lumpectomy followed by radiation.  There is no evidence of disease recurrence.  Both axillae are benign  LAB RESULTS:  CMP     Component Value Date/Time   NA 141 04/14/2020 0938   K 3.8 04/14/2020 0938   CL 105 04/14/2020 0938   CO2 24 04/14/2020 0938   GLUCOSE 132 (H) 04/14/2020 0938   BUN 19 04/14/2020 0938   CREATININE 1.04 (H) 04/14/2020 0938   CREATININE 0.98 12/28/2017 0941   CALCIUM 9.6 04/14/2020 0938   PROT 7.5 04/14/2020 0938   ALBUMIN 4.0 04/14/2020 0938    AST 17 04/14/2020 0938   AST 15 12/28/2017 0941   ALT 16 04/14/2020 0938   ALT 11 12/28/2017 0941   ALKPHOS 69 04/14/2020 0938   BILITOT 1.5 (H) 04/14/2020 0938   BILITOT 1.8 (H) 12/28/2017 0941   GFRNONAA 60 (L) 04/14/2020 0938   GFRNONAA >60 12/28/2017 0941   GFRAA >60 04/15/2019 0952   GFRAA >60 12/28/2017 0941    No results found for: TOTALPROTELP, ALBUMINELP, A1GS, A2GS, BETS,  BETA2SER, GAMS, MSPIKE, SPEI  No results found for: Nils Pyle, [facility]  Lab Results  Component Value Date   WBC 8.7 04/14/2020   NEUTROABS 5.7 04/14/2020   HGB 12.5 04/14/2020   HCT 37.3 04/14/2020   MCV 97.4 04/14/2020   PLT 243 04/14/2020   No results found for: LABCA2  No components found for: [id number]  No results for input(s): INR in the last 168 hours.  No results found for: LABCA2  No results found for: VZS827  No results found for: MBE675  No results found for: QGB201  No results found for: CA2729  No components found for: HGQUANT  No results found for: CEA1 / No results found for: CEA1   No results found for: AFPTUMOR  No results found for: CHROMOGRNA  No results found for: HGBA, HGBA2QUANT, HGBFQUANT, HGBSQUAN (Hemoglobinopathy evaluation)   No results found for: LDH  No results found for: IRON, TIBC, IRONPCTSAT (Iron and TIBC)  No results found for: FERRITIN  Urinalysis    Component Value Date/Time   COLORURINE YELLOW 07/29/2017 1231   APPEARANCEUR CLEAR 07/29/2017 1231   LABSPEC 1.010 07/29/2017 1231   PHURINE 5.0 07/29/2017 1231   GLUCOSEU NEGATIVE 07/29/2017 1231   HGBUR NEGATIVE 07/29/2017 1231   BILIRUBINUR NEGATIVE 07/29/2017 1231   KETONESUR NEGATIVE 07/29/2017 1231   PROTEINUR NEGATIVE 07/29/2017 1231   NITRITE NEGATIVE 07/29/2017 1231   LEUKOCYTESUR NEGATIVE 07/29/2017 1231    STUDIES: DG Bone Density  Result Date: 04/08/2020 EXAM: DUAL X-RAY ABSORPTIOMETRY (DXA) FOR BONE MINERAL DENSITY IMPRESSION: Referring Physician:   Chauncey Cruel Your patient completed a BMD test using Lunar IDXA DXA system ( analysis version: 16 ) manufactured by [employer]. Technologist: KT PATIENT: Name: Danijela, Vessey Patient ID: [id number] Birth Date: Jun 03, 1955 Height: 67.5 in. Sex: Female Measured: 04/08/2020 Weight: 236.6 lbs. Indications: Anastrazole, Breast Cancer History, Caucasian, Estrogen Deficient, History of Fracture (Adult) (V15.51), Postmenopausal, Secondary Osteoporosis Fractures: Knee Treatments: None ASSESSMENT: The BMD measured at Femur Neck Left is 0.880 g/cm2 with a T-score of -1.1. This patient is considered OSTEOPENIC according to [facility]) criteria. The scan quality is good. Lumbar spine was not utilized due to advanced degenerative changes. Site Region Measured Date Measured Age YA T-score BMD Significant CHANGE DualFemur Neck Left 04/08/2020 65.7 -1.1 0.880 g/cm2 * DualFemur Neck Left 10/03/2017 63.2 -0.6 0.950 g/cm2 DualFemur Total Mean 04/08/2020 65.7 -0.8 0.907 g/cm2 * DualFemur Total Mean 10/03/2017 63.2 -0.5 0.944 g/cm2 Right Forearm Radius 33% 04/08/2020 65.7 0.7 0.951 g/cm2 Right Forearm Radius 33% 10/03/2017 63.2 1.1 0.988 g/cm2 World Health Organization [facility]) criteria for post-menopausal, Caucasian Women: Normal       T-score at or above -1 SD Osteopenia   T-score between -1 and -2.5 SD Osteoporosis T-score at or below -2.5 SD RECOMMENDATION: 1. All patients should optimize calcium and vitamin D intake. 2. Consider FDA approved medical therapies in postmenopausal women and men aged 20 years and older, based on the following: a. A hip or vertebral (clinical or morphometric) fracture b. T-score < or = -2.5 at the femoral neck or spine after appropriate evaluation to exclude secondary causes c. Low bone mass (T-score between -1.0 and -2.5 at the femoral neck or spine) and a 10 year probability of a hip fracture > or = 3% or a 10 year probability of a major osteoporosis-related fracture > or =  20% based on the [place]-adapted WHO algorithm d. Clinician judgment and/or patient preferences may indicate treatment for people with 10-year fracture  probabilities above or below these levels FOLLOW-UP: Patients with diagnosis of osteoporosis or at high risk for fracture should have regular bone mineral density tests. For patients eligible for Medicare, routine testing is allowed once every 2 years. The testing frequency can be increased to one year for patients who have rapidly progressing disease, those who are receiving or discontinuing medical therapy to restore bone mass, or have additional risk factors. I have reviewed this report and agree with the above findings. Mark A. Thornton Papas, M.D.  Radiology FRAX* 10-year Probability of Fracture Based on femoral neck BMD: DualFemur (Left) Major Osteoporotic Fracture: 12.6% Hip Fracture:                1.0% Population:                  [place] (Caucasian) Risk Factors: History of Fracture (Adult) (V15.51), Secondary Osteoporosis *FRAX is a Materials engineer of the [employer] of [employer] for Metabolic Bone Disease, a World Pharmacologist (WHO) [facility]. ASSESSMENT: The probability of a major osteoporotic fracture is 12.6 % within the next ten years. The probability of a hip fracture is 1.0 % within the next ten years. I have reviewed this report and agree with the above findings. Mark A. Thornton Papas, M.D. [facility] Radiology Electronically Signed   By: Lavonia Dana M.D.   On: 04/08/2020 11:40   MM DIAG BREAST TOMO BILATERAL  Result Date: 04/08/2020 CLINICAL DATA:  65 year old female status post malignant left lumpectomy in 2019. EXAM: DIGITAL DIAGNOSTIC BILATERAL MAMMOGRAM WITH TOMOSYNTHESIS AND CAD TECHNIQUE: Bilateral digital diagnostic mammography and breast tomosynthesis was performed. The images were evaluated with computer-aided detection. COMPARISON:  Previous exam(s). ACR Breast Density Category b: There are scattered areas of  fibroglandular density. FINDINGS: Stable post lumpectomy changes in the far posterior lower inner left breast. No new or suspicious findings in the remainder of either breast. The parenchymal pattern is stable. IMPRESSION: 1. No mammographic evidence of malignancy in either breast. 2. Stable left breast posttreatment changes. RECOMMENDATION: Screening mammogram in one year.(Code:[id number]-B-01Y) I have discussed the findings and recommendations with the patient. If applicable, a reminder letter will be sent to the patient regarding the next appointment. BI-RADS CATEGORY  2: Benign. Electronically Signed   By: Kristopher Oppenheim M.D.   On: 04/08/2020 12:15    ELIGIBLE FOR AVAILABLE RESEARCH PROTOCOL: no  ASSESSMENT: 65 y.o.  [place] woman status post left breast lower inner quadrant biopsy 03/28/2017 for a clinical mT1b N0, stage I invasive ductal carcinoma, grade 2, estrogen and progesterone receptor strongly positive, with no HER-2 amplification, and an MIB-1 of 2%.  (1) status post left lumpectomy and sentinel lymph node sampling 05/10/2017 for an mpT2 pN0, stage IB invasive ductal carcinoma, grade 1, with negative margins.  (a) a total of 3 sentinel lymph nodes were removed  (2) The Oncotype DX score was 14, predicting a risk of outside the breast recurrence over the next 10 years of 4% if the patient's only systemic therapy is tamoxifen for 5 years.  It also predicts no significant benefit from chemotherapy.  (3) adjuvant radiation completed 07/21/2017  (4) anastrozole started 07/24/2017  (a) bone density 10/03/2017 showed a T score of -0.8 (normal).  (b) bone density 04/08/2020 shows a T score of -1.1   PLAN: Charlcie is now just about 3 years out from definitive surgery for her breast cancer with no evidence of disease recurrence.  This is very favorable.  She is tolerating anastrozole well and the  plan is to continue that a total of 5 years.  She has had minimal bone loss and has very mild  osteopenia.  I think she would benefit from starting vitamin D.  We discussed that today and I put the prescription in for her.  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  She knows to call for any other issue that may develop before that visit  Total encounter time 20 minutes.*  Finian Helvey, Virgie Dad, MD  04/14/20 12:55 PM Medical Oncology and Hematology [facility] [place], [place] [address] Tel. [phone]    Fax. [phone]   I, Wilburn Mylar, am acting as scribe for Dr. Virgie Dad. Merwin Breden.  I, Lurline Del MD, have reviewed the above documentation for accuracy and completeness, and I agree with the above.   *Total Encounter Time as defined by the Centers for Medicare and Medicaid Services includes, in addition to the face-to-face time of a patient visit (documented in the note above) non-face-to-face time: obtaining and reviewing outside history, ordering and reviewing medications, tests or procedures, care coordination (communications with other health care professionals or caregivers) and documentation in the medical record.

## 2020-04-14 ENCOUNTER — Other Ambulatory Visit: Payer: Self-pay

## 2020-04-14 ENCOUNTER — Inpatient Hospital Stay: Payer: Medicare Other

## 2020-04-14 ENCOUNTER — Inpatient Hospital Stay: Payer: Medicare Other | Attending: Oncology | Admitting: Oncology

## 2020-04-14 ENCOUNTER — Telehealth: Payer: Self-pay | Admitting: Oncology

## 2020-04-14 VITALS — BP 143/62 | HR 59 | Temp 97.7°F | Resp 18 | Ht 68.0 in | Wt 237.9 lb

## 2020-04-14 DIAGNOSIS — C50312 Malignant neoplasm of lower-inner quadrant of left female breast: Secondary | ICD-10-CM

## 2020-04-14 DIAGNOSIS — Z170 Estrogen receptor positive status [ER+]: Secondary | ICD-10-CM

## 2020-04-14 DIAGNOSIS — Z923 Personal history of irradiation: Secondary | ICD-10-CM | POA: Insufficient documentation

## 2020-04-14 DIAGNOSIS — M8580 Other specified disorders of bone density and structure, unspecified site: Secondary | ICD-10-CM | POA: Diagnosis not present

## 2020-04-14 LAB — COMPREHENSIVE METABOLIC PANEL
ALT: 16 U/L (ref 0–44)
AST: 17 U/L (ref 15–41)
Albumin: 4 g/dL (ref 3.5–5.0)
Alkaline Phosphatase: 69 U/L (ref 38–126)
Anion gap: 12 (ref 5–15)
BUN: 19 mg/dL (ref 8–23)
CO2: 24 mmol/L (ref 22–32)
Calcium: 9.6 mg/dL (ref 8.9–10.3)
Chloride: 105 mmol/L (ref 98–111)
Creatinine, Ser: 1.04 mg/dL — ABNORMAL HIGH (ref 0.44–1.00)
GFR, Estimated: 60 mL/min — ABNORMAL LOW (ref >60)
Glucose, Bld: 132 mg/dL — ABNORMAL HIGH (ref 70–99)
Potassium: 3.8 mmol/L (ref 3.5–5.1)
Sodium: 141 mmol/L (ref 135–145)
Total Bilirubin: 1.5 mg/dL — ABNORMAL HIGH (ref 0.3–1.2)
Total Protein: 7.5 g/dL (ref 6.5–8.1)

## 2020-04-14 LAB — CBC WITH DIFFERENTIAL/PLATELET
Abs Immature Granulocytes: 0.04 10*3/uL (ref 0.00–0.07)
Basophils Absolute: 0.1 10*3/uL (ref 0.0–0.1)
Basophils Relative: 1 %
Eosinophils Absolute: 0.1 10*3/uL (ref 0.0–0.5)
Eosinophils Relative: 1 %
HCT: 37.3 % (ref 36.0–46.0)
Hemoglobin: 12.5 g/dL (ref 12.0–15.0)
Immature Granulocytes: 1 %
Lymphocytes Relative: 26 %
Lymphs Abs: 2.3 10*3/uL (ref 0.7–4.0)
MCH: 32.6 pg (ref 26.0–34.0)
MCHC: 33.5 g/dL (ref 30.0–36.0)
MCV: 97.4 fL (ref 80.0–100.0)
Monocytes Absolute: 0.6 10*3/uL (ref 0.1–1.0)
Monocytes Relative: 7 %
Neutro Abs: 5.7 10*3/uL (ref 1.7–7.7)
Neutrophils Relative %: 64 %
Platelets: 243 10*3/uL (ref 150–400)
RBC: 3.83 MIL/uL — ABNORMAL LOW (ref 3.87–5.11)
RDW: 12.4 % (ref 11.5–15.5)
WBC: 8.7 10*3/uL (ref 4.0–10.5)
nRBC: 0 % (ref 0.0–0.2)

## 2020-04-14 MED ORDER — ANASTROZOLE 1 MG PO TABS
1.0000 mg | ORAL_TABLET | Freq: Every day | ORAL | 4 refills | Status: DC
Start: 2020-04-14 — End: 2020-09-15

## 2020-04-14 MED ORDER — VITAMIN D 25 MCG (1000 UNIT) PO TABS: 1000.0000 [IU] | ORAL_TABLET | Freq: Every day | ORAL | 3 refills | Status: AC

## 2020-04-14 NOTE — Telephone Encounter (Signed)
Scheduled appt per 3/22 los. Pt aware.  

## 2020-05-03 ENCOUNTER — Ambulatory Visit
Admission: EM | Admit: 2020-05-03 | Discharge: 2020-05-03 | Disposition: A | Discharge status: Home / Self Care | Payer: Medicare Other | Attending: Physician Assistant | Admitting: Physician Assistant

## 2020-05-03 ENCOUNTER — Encounter: Payer: Self-pay | Admitting: Emergency Medicine

## 2020-05-03 ENCOUNTER — Ambulatory Visit (INDEPENDENT_AMBULATORY_CARE_PROVIDER_SITE_OTHER): Payer: Medicare Other

## 2020-05-03 DIAGNOSIS — M79671 Pain in right foot: Secondary | ICD-10-CM

## 2020-05-03 DIAGNOSIS — S92404A Nondisplaced unspecified fracture of right great toe, initial encounter for closed fracture: Secondary | ICD-10-CM

## 2020-05-03 NOTE — Discharge Instructions (Signed)
Wear post op shoe Take ibuprofen or tylenol for pain  Follow up with orthopedics

## 2020-05-03 NOTE — ED Triage Notes (Signed)
Dropped a saw horse on RT foot yesterday.  Pain to great toe and top of foot.

## 2020-05-05 ENCOUNTER — Ambulatory Visit (INDEPENDENT_AMBULATORY_CARE_PROVIDER_SITE_OTHER): Payer: Medicare Other | Admitting: Orthopedic Surgery

## 2020-05-05 ENCOUNTER — Other Ambulatory Visit: Payer: Self-pay

## 2020-05-05 ENCOUNTER — Encounter: Payer: Self-pay | Admitting: Orthopedic Surgery

## 2020-05-05 VITALS — Ht 68.0 in | Wt 237.0 lb

## 2020-05-05 DIAGNOSIS — S92414A Nondisplaced fracture of proximal phalanx of right great toe, initial encounter for closed fracture: Secondary | ICD-10-CM

## 2020-05-05 NOTE — Progress Notes (Signed)
New Patient Visit  Assessment: Meredith Donaldson is a 65 y.o. female with the following: Right great toe, proximal phalanx fracture; minimally displaced  Plan: Continue postop shoe, NWB through forefoot.  WBAT through heel Medications as needed Cannot drive while wearing shoe Ok to transition to a regular shoe as tolerated.  Elevate foot to improve swelling and pain   Follow-up: Return in about 4 weeks (around 06/02/2020).  Subjective:  Chief Complaint  Patient presents with  . Foot Injury    Rt Big Toe DOI 05/02/20    History of Present Illness: Meredith Donaldson is a 65 y.o. female who presents for evaluation of right foot pain.  She was working in her yard 3 days ago when a metal sawhorse fell directly on to her right foot.  She had immediate pain.  She presented to an urgent care and XR showed a fracture of her great toe.  She has been wearing a postop shoe since.  Pain is controlled with Tylenol.  She tried wearing a regular shoe, but this wasn't tolerated.  Pain improves with elevation.  No pain elsewhere  Review of Systems: No fevers or chills No numbness or tingling No chest pain No shortness of breath No bowel or bladder dysfunction No GI distress No headaches   Medical History:  Past Medical History:  Diagnosis Date  . Cancer ([place])   . Cataracts, bilateral   . Complication of anesthesia   . Hyperlipidemia   . Hypertension   . Personal history of radiation therapy    left breast 2019  . PONV (postoperative nausea and vomiting)     Past Surgical History:  Procedure Laterality Date  . BREAST BIOPSY Left 03/28/2017  . BREAST BIOPSY Left 04/13/2017  . BREAST LUMPECTOMY Left 05/10/2017  . BREAST LUMPECTOMY WITH RADIOACTIVE SEED AND SENTINEL LYMPH NODE BIOPSY Left 05/10/2017   Procedure: BREAST LUMPECTOMY WITH RADIOACTIVE SEED X2 AND SENTINEL LYMPH NODE BIOPSY;  Surgeon: Fanny Skates, MD;  Location: [facility];  Service: General;  Laterality: Left;  . CESAREAN  SECTION    . FRACTURE SURGERY Right 2008   right leg fracture s/p pinning    Family History  Problem Relation Age of Onset  . Heart attack Father   . Breast cancer Cousin        maternal first cousin  . Breast cancer Cousin        maternal first cousin  . Cancer Neg Hx    Social History   Tobacco Use  . Smoking status: Never Smoker  . Smokeless tobacco: Never Used  Vaping Use  . Vaping Use: Never used  Substance Use Topics  . Alcohol use: No  . Drug use: No    Allergies  Allergen Reactions  . Penicillins Rash and Other (See Comments)    Has patient had a PCN reaction causing immediate rash, facial/tongue/throat swelling, SOB or lightheadedness with hypotension: Unknown Has patient had a PCN reaction causing severe rash involving mucus membranes or skin necrosis: Unknown Has patient had a PCN reaction that required hospitalization: Unknown Has patient had a PCN reaction occurring within the last 10 years: No If all of the above answers are "NO", then may proceed with Cephalosporin use.   . Sulfa Antibiotics Rash    Current Meds  Medication Sig  . acetaminophen (TYLENOL) 500 MG tablet Take 1,000 mg by mouth every 6 (six) hours as needed for moderate pain or headache.   . anastrozole (ARIMIDEX) 1 MG tablet Take 1 tablet (  1 mg total) by mouth daily.  . bisoprolol-hydrochlorothiazide (ZIAC) 10-6.25 MG tablet Take 1 tablet by mouth daily.  Marland Kitchen lisinopril (ZESTRIL) 10 MG tablet Take 1 tablet (10 mg total) by mouth daily.  . Omega-3 Fatty Acids (FISH OIL) 1200 MG CAPS Take 2,400 mg by mouth daily before lunch.   . vitamin C (ASCORBIC ACID) 500 MG tablet Take 500 mg by mouth daily before lunch.   . vitamin E 400 UNIT capsule Take 400 Units by mouth daily before lunch.    Objective: Ht 5\' 8"  (1.727 m)   Wt 237 lb (107.5 kg)   BMI 36.04 kg/m   Physical Exam:  General: Alert and oriented.  No acute distress. Gait: Right-sided antalgic gait.  Bearing weight through her  heel.  Evaluation right foot demonstrates swelling about the great toe.  She is obvious ecchymosis.  Tenderness to palpation, particularly on the lateral aspect of the great toe.  Limited range of motion at the first MTP.  Sensation is intact throughout the foot.  Toes are warm and well perfused.  Brisk capillary refill.  Active motion intact in the TA/EHL.    IMAGING: I personally reviewed images previously obtained from the ED   X-ray of the right foot was previous obtained and demonstrates a minimally displaced fracture of the proximal phalanx of the great toe.  Fracture is located on the lateral aspect of the distal proximal phalanx.   New Medications:  No orders of the defined types were placed in this encounter.     Mordecai Rasmussen, MD  05/05/2020 11:46 AM

## 2020-05-06 NOTE — ED Provider Notes (Signed)
[place]    CSN: [id number] Arrival date & time: 05/03/20  1106      History   Chief Complaint Chief Complaint  Patient presents with  . Foot Injury    HPI Meredith Donaldson is a 65 y.o. female.   Pt complains or right great toe pain that started yesterday after a saw horse (saw table) fell on her foot.  She reports swelling and bruising.  She denies any other injuries.  She has taken nothing for the pain.      Past Medical History:  Diagnosis Date  . Cancer ([place])   . Cataracts, bilateral   . Complication of anesthesia   . Hyperlipidemia   . Hypertension   . Personal history of radiation therapy    left breast 2019  . PONV (postoperative nausea and vomiting)     Patient Active Problem List   Diagnosis Date Noted  . Gilberts syndrome 10/08/2018  . Malignant neoplasm of lower-inner quadrant of left breast in female, estrogen receptor positive ([place]) 04/12/2017    Past Surgical History:  Procedure Laterality Date  . BREAST BIOPSY Left 03/28/2017  . BREAST BIOPSY Left 04/13/2017  . BREAST LUMPECTOMY Left 05/10/2017  . BREAST LUMPECTOMY WITH RADIOACTIVE SEED AND SENTINEL LYMPH NODE BIOPSY Left 05/10/2017   Procedure: BREAST LUMPECTOMY WITH RADIOACTIVE SEED X2 AND SENTINEL LYMPH NODE BIOPSY;  Surgeon: Fanny Skates, MD;  Location: [facility];  Service: General;  Laterality: Left;  . CESAREAN SECTION    . FRACTURE SURGERY Right 2008   right leg fracture s/p pinning    OB History   No obstetric history on file.      Home Medications    Prior to Admission medications   Medication Sig Start Date End Date Taking? Authorizing Provider  acetaminophen (TYLENOL) 500 MG tablet Take 1,000 mg by mouth every 6 (six) hours as needed for moderate pain or headache.     [provider]  anastrozole (ARIMIDEX) 1 MG tablet Take 1 tablet (1 mg total) by mouth daily. 04/14/20   Magrinat, Virgie Dad, MD  bisoprolol-hydrochlorothiazide [facility]) 10-6.25 MG tablet Take  1 tablet by mouth daily. 12/08/15   [provider]  cholecalciferol (VITAMIN D3) 25 MCG (1000 UNIT) tablet Take 1 tablet (1,000 Units total) by mouth daily. Patient not taking: Reported on 05/05/2020 04/14/20   Magrinat, Virgie Dad, MD  lisinopril (ZESTRIL) 10 MG tablet Take 1 tablet (10 mg total) by mouth daily. 04/14/20   Magrinat, Virgie Dad, MD  Omega-3 Fatty Acids (FISH OIL) 1200 MG CAPS Take 2,400 mg by mouth daily before lunch.     [provider]  vitamin C (ASCORBIC ACID) 500 MG tablet Take 500 mg by mouth daily before lunch.     [provider]  vitamin E 400 UNIT capsule Take 400 Units by mouth daily before lunch.    [provider]    Family History Family History  Problem Relation Age of Onset  . Heart attack Father   . Breast cancer Cousin        maternal first cousin  . Breast cancer Cousin        maternal first cousin  . Cancer Neg Hx     Social History Social History   Tobacco Use  . Smoking status: Never Smoker  . Smokeless tobacco: Never Used  Vaping Use  . Vaping Use: Never used  Substance Use Topics  . Alcohol use: No  . Drug use: No  Allergies   Penicillins and Sulfa antibiotics   Review of Systems Review of Systems  Constitutional: Negative for chills and fever.  HENT: Negative for ear pain and sore throat.   Eyes: Negative for pain and visual disturbance.  Respiratory: Negative for cough and shortness of breath.   Cardiovascular: Negative for chest pain and palpitations.  Gastrointestinal: Negative for abdominal pain and vomiting.  Genitourinary: Negative for dysuria and hematuria.  Musculoskeletal: Positive for arthralgias (right great toe pain). Negative for back pain.  Skin: Negative for color change and rash.  Neurological: Negative for seizures and syncope.  All other systems reviewed and are negative.    Physical Exam Triage Vital Signs ED Triage Vitals  Enc Vitals Group     BP 05/03/20 1137 (!)  157/76     Pulse Rate 05/03/20 1137 (!) 58     Resp 05/03/20 1137 19     Temp 05/03/20 1137 98.2 F (36.8 C)     Temp Source 05/03/20 1137 Oral     SpO2 05/03/20 1137 98 %     Weight --      Height --      Head Circumference --      Peak Flow --      Pain Score 05/03/20 1136 6     Pain Loc --      Pain Edu? --      Excl. in [place]? --    No data found.  Updated Vital Signs BP (!) 157/76 (BP Location: Right Arm)   Pulse (!) 58   Temp 98.2 F (36.8 C) (Oral)   Resp 19   SpO2 98%   Visual Acuity Right Eye Distance:   Left Eye Distance:   Bilateral Distance:    Right Eye Near:   Left Eye Near:    Bilateral Near:     Physical Exam Vitals and nursing note reviewed.  Constitutional:      General: She is not in acute distress.    Appearance: She is well-developed.  HENT:     Head: Normocephalic and atraumatic.  Eyes:     Conjunctiva/sclera: Conjunctivae normal.  Cardiovascular:     Rate and Rhythm: Normal rate and regular rhythm.     Heart sounds: No murmur heard.   Pulmonary:     Effort: Pulmonary effort is normal. No respiratory distress.     Breath sounds: Normal breath sounds.  Abdominal:     Palpations: Abdomen is soft.     Tenderness: There is no abdominal tenderness.  Musculoskeletal:     Cervical back: Neck supple.     Left foot: Normal range of motion. Swelling (right great toe) and bony tenderness (right great toe, distal) present. Normal pulse.     Comments: No nail damage  Skin:    General: Skin is warm and dry.  Neurological:     Mental Status: She is alert.      UC Treatments / Results  Labs (all labs ordered are listed, but only abnormal results are displayed) Labs Reviewed - No data to display  EKG   Radiology No results found.     Procedures Procedures (including critical care time)  Medications Ordered in UC Medications - No data to display  Initial Impression / Assessment and Plan / UC Course  I have reviewed the triage  vital signs and the nursing notes.  Pertinent labs & imaging results that were available during my care of the patient were reviewed by me and considered in my  medical decision making (see chart for details).     Imaging shows fracture to lateral aspect of proximal portion of right great tow. Post op shoe given today.  Advised to follow up with orthopedics. Apply ice for swelling.  Can take ibuprofen/tylenol as needed. Final Clinical Impressions(s) / UC Diagnoses   Final diagnoses:  Nondisplaced unspecified fracture of right great toe, initial encounter for closed fracture     Discharge Instructions     Wear post op shoe Take ibuprofen or tylenol for pain  Follow up with orthopedics   ED Prescriptions    None     PDMP not reviewed this encounter.   Konrad Felix, PA-C 05/06/20 1510

## 2020-06-05 ENCOUNTER — Ambulatory Visit: Payer: Medicare Other

## 2020-06-05 ENCOUNTER — Encounter: Payer: Self-pay | Admitting: Orthopedic Surgery

## 2020-06-05 ENCOUNTER — Ambulatory Visit (INDEPENDENT_AMBULATORY_CARE_PROVIDER_SITE_OTHER): Payer: Medicare Other | Admitting: Orthopedic Surgery

## 2020-06-05 ENCOUNTER — Other Ambulatory Visit: Payer: Self-pay

## 2020-06-05 DIAGNOSIS — S92414D Nondisplaced fracture of proximal phalanx of right great toe, subsequent encounter for fracture with routine healing: Secondary | ICD-10-CM | POA: Diagnosis not present

## 2020-06-05 NOTE — Progress Notes (Signed)
[facility]thopaedic Clinic Return  Assessment: Meredith Donaldson is a 65 y.o. female with the following: Nondisplaced fracture of the proximal phalanx of the right great toe  Plan: Patient has done well.  She has transition to a regular shoe.  She may continue to have some discomfort and swelling in the great toe, but this should continue to improve.  Medications as needed.  No follow-up is necessary at this time.  If she has any issues in the future, she should contact the clinic  Follow-up: Return if symptoms worsen or fail to improve.   Subjective:  Chief Complaint  Patient presents with  . Foot Injury    Right great toe fracture 05/02/20 improving / wearing sneakers now has some pain at times but better     History of Present Illness: Meredith Donaldson is a 65 y.o. female who returns to clinic today for repeat evaluation of her right great toe.  It has been a little over a month since she sustained the injury to her great toe.  Her pain is improved.  She is now wearing a regular shoe.  She does notice some swelling in the toe and foot periodically, which prevents her from wearing a regular shoe.  Overall, she feels well  Review of Systems: No fevers or chills No numbness or tingling No chest pain No shortness of breath No bowel or bladder dysfunction No GI distress No headaches  Objective: There were no vitals taken for this visit.  Physical Exam:  Alert and oriented.  No acute distress. Normal gait in regular shoes.  Evaluation of the right foot demonstrates no swelling.  She has minimal tenderness to palpation about the great toe.  Active motion intact in the EHL/TA.  Toes are warm and well-perfused.  Sensation is intact throughout  IMAGING: I personally ordered and reviewed the following images:   X-ray of the right foot was obtained in clinic today and demonstrates a small, minimally displaced fracture through the lateral aspect of the proximal phalanx of the great toe.   Compared to previous x-rays the fracture remains nondisplaced.  No evidence of further displacement.  No further injuries are noted.  Impression: Healing minimally displaced fracture of the right great toe, proximal phalanx  Mordecai Rasmussen, MD 06/05/2020 9:39 AM

## 2020-09-15 ENCOUNTER — Other Ambulatory Visit: Payer: Self-pay | Admitting: Oncology

## 2020-11-23 ENCOUNTER — Other Ambulatory Visit: Payer: Self-pay | Admitting: Hematology and Oncology

## 2020-11-23 DIAGNOSIS — Z1231 Encounter for screening mammogram for malignant neoplasm of breast: Secondary | ICD-10-CM

## 2020-12-08 ENCOUNTER — Other Ambulatory Visit: Payer: Self-pay | Admitting: Oncology

## 2020-12-12 ENCOUNTER — Other Ambulatory Visit: Payer: Self-pay | Admitting: Oncology

## 2021-02-21 ENCOUNTER — Other Ambulatory Visit: Payer: Self-pay

## 2021-02-21 ENCOUNTER — Ambulatory Visit
Admission: EM | Admit: 2021-02-21 | Discharge: 2021-02-21 | Disposition: A | Discharge status: Home / Self Care | Payer: Medicare Other | Attending: Student | Admitting: Student

## 2021-02-21 DIAGNOSIS — U071 COVID-19: Secondary | ICD-10-CM

## 2021-02-21 DIAGNOSIS — Z20822 Contact with and (suspected) exposure to covid-19: Secondary | ICD-10-CM

## 2021-02-21 DIAGNOSIS — Z20828 Contact with and (suspected) exposure to other viral communicable diseases: Secondary | ICD-10-CM

## 2021-02-21 MED ORDER — MOLNUPIRAVIR EUA 200MG CAPSULE: 4.0000 | ORAL_CAPSULE | Freq: Two times a day (BID) | ORAL | 0 refills | Status: AC

## 2021-02-21 NOTE — Discharge Instructions (Signed)
-  molnupiravir twice daily x5 days -With a virus, you're typically contagious for 5-7 days, or as long as you're having fevers.  -Follow-up if symptoms worsen - shortness of breath, chest pain, ear pain, etc.

## 2021-02-21 NOTE — ED Triage Notes (Signed)
Patient states she tested positive for Covid today and it was positive  Patient states she has a temp this morning and congested in her throat and chest  Patient states she is achy all over  Patient states she took 2 tylenol extra strength this morning

## 2021-02-21 NOTE — ED Provider Notes (Signed)
[facility]    CSN: [id number] Arrival date & time: 02/21/21  1030      History   Chief Complaint Chief Complaint  Patient presents with   Fever    Cough, chills and fever. Tested positive for Covid    HPI Meredith Donaldson is a 66 y.o. female presenting with fever, cough, chills following tested positive for COVID at home. States exposure to both covid and influenza at home. Medical history hypertension, hyperlipidemia.  Describes nasal congestion, chest congestion, generalized body aches. Temperature running 101 at home, has tried Tylenol Extra Strength with some relief about 4 hours ago.  Denies shortness of breath, chest pain, dizziness, weakness.  Tolerating fluids and food  HPI  Past Medical History:  Diagnosis Date   Cancer ([place])    Cataracts, bilateral    Complication of anesthesia    Hyperlipidemia    Hypertension    Personal history of radiation therapy    left breast 2019   PONV (postoperative nausea and vomiting)     Patient Active Problem List   Diagnosis Date Noted   Gilberts syndrome 10/08/2018   Malignant neoplasm of lower-inner quadrant of left breast in female, estrogen receptor positive ([place]) 04/12/2017    Past Surgical History:  Procedure Laterality Date   BREAST BIOPSY Left 03/28/2017   BREAST BIOPSY Left 04/13/2017   BREAST LUMPECTOMY Left 05/10/2017   BREAST LUMPECTOMY WITH RADIOACTIVE SEED AND SENTINEL LYMPH NODE BIOPSY Left 05/10/2017   Procedure: BREAST LUMPECTOMY WITH RADIOACTIVE SEED X2 AND SENTINEL LYMPH NODE BIOPSY;  Surgeon: Fanny Skates, MD;  Location: [place];  Service: General;  Laterality: Left;   CESAREAN SECTION     FRACTURE SURGERY Right 2008   right leg fracture s/p pinning    OB History   No obstetric history on file.      Home Medications    Prior to Admission medications   Medication Sig Start Date End Date Taking? Authorizing Provider  molnupiravir EUA (LAGEVRIO) 200 mg CAPS capsule Take 4 capsules  (800 mg total) by mouth 2 (two) times daily for 5 days. 02/21/21 02/26/21 Yes Hazel Sams, PA-C  acetaminophen (TYLENOL) 500 MG tablet Take 1,000 mg by mouth every 6 (six) hours as needed for moderate pain or headache.     [provider]  anastrozole (ARIMIDEX) 1 MG tablet Take 1 tablet by mouth once daily 12/14/20   Magrinat, Virgie Dad, MD  bisoprolol-hydrochlorothiazide [facility]) 10-6.25 MG tablet Take 1 tablet by mouth daily. 12/08/15   [provider]  cholecalciferol (VITAMIN D3) 25 MCG (1000 UNIT) tablet Take 1 tablet (1,000 Units total) by mouth daily. Patient not taking: No sig reported 04/14/20   Magrinat, Virgie Dad, MD  lisinopril (ZESTRIL) 10 MG tablet Take 1 tablet (10 mg total) by mouth daily. 04/14/20   Magrinat, Virgie Dad, MD  Omega-3 Fatty Acids (FISH OIL) 1200 MG CAPS Take 2,400 mg by mouth daily before lunch.     [provider]  vitamin C (ASCORBIC ACID) 500 MG tablet Take 500 mg by mouth daily before lunch.     [provider]  vitamin E 400 UNIT capsule Take 400 Units by mouth daily before lunch.    [provider]    Family History Family History  Problem Relation Age of Onset   Heart attack Father    Breast cancer Cousin        maternal first cousin   Breast cancer Cousin  maternal first cousin   Cancer Neg Hx     Social History Social History   Tobacco Use   Smoking status: Never   Smokeless tobacco: Never  Vaping Use   Vaping Use: Never used  Substance Use Topics   Alcohol use: No   Drug use: No     Allergies   Penicillins and Sulfa antibiotics   Review of Systems Review of Systems  Constitutional:  Positive for chills and fever. Negative for appetite change.  HENT:  Positive for congestion. Negative for ear pain, rhinorrhea, sinus pressure, sinus pain and sore throat.   Eyes:  Negative for redness and visual disturbance.  Respiratory:  Positive for cough. Negative for chest tightness, shortness of  breath and wheezing.   Cardiovascular:  Negative for chest pain and palpitations.  Gastrointestinal:  Negative for abdominal pain, constipation, diarrhea, nausea and vomiting.  Genitourinary:  Negative for dysuria, frequency and urgency.  Musculoskeletal:  Negative for myalgias.  Neurological:  Negative for dizziness, weakness and headaches.  Psychiatric/Behavioral:  Negative for confusion.   All other systems reviewed and are negative.   Physical Exam Triage Vital Signs ED Triage Vitals  Enc Vitals Group     BP 02/21/21 1207 134/81     Pulse Rate 02/21/21 1207 (!) 58     Resp 02/21/21 1207 16     Temp 02/21/21 1207 97.7 F (36.5 C)     Temp Source 02/21/21 1207 Oral     SpO2 02/21/21 1207 98 %     Weight --      Height --      Head Circumference --      Peak Flow --      Pain Score 02/21/21 1204 3     Pain Loc --      Pain Edu? --      Excl. in [place]? --    No data found.  Updated Vital Signs BP 134/81 (BP Location: Right Arm)    Pulse (!) 58    Temp 97.7 F (36.5 C) (Oral)    Resp 16    SpO2 98%   Visual Acuity Right Eye Distance:   Left Eye Distance:   Bilateral Distance:    Right Eye Near:   Left Eye Near:    Bilateral Near:     Physical Exam Vitals reviewed.  Constitutional:      General: She is not in acute distress.    Appearance: Normal appearance. She is not ill-appearing.  HENT:     Head: Normocephalic and atraumatic.     Right Ear: Tympanic membrane, ear canal and external ear normal. No tenderness. No middle ear effusion. There is no impacted cerumen. Tympanic membrane is not perforated, erythematous, retracted or bulging.     Left Ear: Tympanic membrane, ear canal and external ear normal. No tenderness.  No middle ear effusion. There is no impacted cerumen. Tympanic membrane is not perforated, erythematous, retracted or bulging.     Nose: Nose normal. No congestion.     Mouth/Throat:     Mouth: Mucous membranes are moist.     Pharynx: Uvula midline.  No oropharyngeal exudate or posterior oropharyngeal erythema.  Eyes:     Extraocular Movements: Extraocular movements intact.     Pupils: Pupils are equal, round, and reactive to light.  Cardiovascular:     Rate and Rhythm: Normal rate and regular rhythm.     Heart sounds: Normal heart sounds.  Pulmonary:     Effort: Pulmonary effort is  normal.     Breath sounds: Normal breath sounds. No decreased breath sounds, wheezing, rhonchi or rales.  Abdominal:     Palpations: Abdomen is soft.     Tenderness: There is no abdominal tenderness. There is no guarding or rebound.  Lymphadenopathy:     Cervical: No cervical adenopathy.     Right cervical: No superficial cervical adenopathy.    Left cervical: No superficial cervical adenopathy.  Neurological:     General: No focal deficit present.     Mental Status: She is alert and oriented to person, place, and time.  Psychiatric:        Mood and Affect: Mood normal.        Behavior: Behavior normal.        Thought Content: Thought content normal.        Judgment: Judgment normal.     UC Treatments / Results  Labs (all labs ordered are listed, but only abnormal results are displayed) Labs Reviewed  COVID-19, FLU A+B NAA    EKG   Radiology No results found.  Procedures Procedures (including critical care time)  Medications Ordered in UC Medications - No data to display  Initial Impression / Assessment and Plan / UC Course  I have reviewed the triage vital signs and the nursing notes.  Pertinent labs & imaging results that were available during my care of the patient were reviewed by me and considered in my medical decision making (see chart for details).     This patient is a very pleasant 66 y.o. year old female presenting with covid-19 following exposure to this. Afebrile, nontachy. Last antipyretic 4 hours ago. No history pulm ds.   Positive home COVID test. Exposure to both covid and influenza. Given patient age she is a  candidate for antiviral. Last CMP >12 months ago and CMP would not return in time for Paxlovid, so will treat with molnupiravir today. Continue OTC medications for additional relief.  ED return precautions discussed. Patient verbalizes understanding and agreement.     Final Clinical Impressions(s) / UC Diagnoses   Final diagnoses:  Exposure to COVID-19 virus  Exposure to influenza  COVID-19     Discharge Instructions      -molnupiravir twice daily x5 days -With a virus, you're typically contagious for 5-7 days, or as long as you're having fevers.  -Follow-up if symptoms worsen - shortness of breath, chest pain, ear pain, etc.      ED Prescriptions     Medication Sig Dispense Auth. Provider   molnupiravir EUA (LAGEVRIO) 200 mg CAPS capsule Take 4 capsules (800 mg total) by mouth 2 (two) times daily for 5 days. 40 capsule Hazel Sams, PA-C      PDMP not reviewed this encounter.   Hazel Sams, PA-C 02/21/21 1242

## 2021-02-22 LAB — COVID-19, FLU A+B NAA
Influenza A, NAA: NOT DETECTED
Influenza B, NAA: NOT DETECTED
SARS-CoV-2, NAA: DETECTED — AB

## 2021-04-09 ENCOUNTER — Ambulatory Visit
Admission: RE | Admit: 2021-04-09 | Discharge: 2021-04-09 | Disposition: A | Discharge status: Home / Self Care | Payer: Medicare Other | Source: Ambulatory Visit | Attending: Hematology and Oncology | Admitting: Hematology and Oncology

## 2021-04-09 DIAGNOSIS — Z1231 Encounter for screening mammogram for malignant neoplasm of breast: Secondary | ICD-10-CM

## 2021-04-12 ENCOUNTER — Other Ambulatory Visit: Payer: Self-pay

## 2021-04-12 DIAGNOSIS — C50312 Malignant neoplasm of lower-inner quadrant of left female breast: Secondary | ICD-10-CM

## 2021-04-14 ENCOUNTER — Other Ambulatory Visit: Payer: Self-pay

## 2021-04-14 ENCOUNTER — Inpatient Hospital Stay: Payer: Medicare Other | Attending: Hematology and Oncology

## 2021-04-14 ENCOUNTER — Encounter: Payer: Self-pay | Admitting: Hematology and Oncology

## 2021-04-14 ENCOUNTER — Inpatient Hospital Stay (HOSPITAL_BASED_OUTPATIENT_CLINIC_OR_DEPARTMENT_OTHER): Payer: Medicare Other | Admitting: Hematology and Oncology

## 2021-04-14 VITALS — BP 156/74 | HR 65 | Temp 97.7°F | Resp 18 | Ht 68.0 in | Wt 240.6 lb

## 2021-04-14 DIAGNOSIS — Z79811 Long term (current) use of aromatase inhibitors: Secondary | ICD-10-CM | POA: Diagnosis not present

## 2021-04-14 DIAGNOSIS — C50312 Malignant neoplasm of lower-inner quadrant of left female breast: Secondary | ICD-10-CM | POA: Insufficient documentation

## 2021-04-14 DIAGNOSIS — M8580 Other specified disorders of bone density and structure, unspecified site: Secondary | ICD-10-CM | POA: Insufficient documentation

## 2021-04-14 DIAGNOSIS — Z170 Estrogen receptor positive status [ER+]: Secondary | ICD-10-CM | POA: Diagnosis not present

## 2021-04-14 DIAGNOSIS — Z923 Personal history of irradiation: Secondary | ICD-10-CM | POA: Insufficient documentation

## 2021-04-14 DIAGNOSIS — Z79899 Other long term (current) drug therapy: Secondary | ICD-10-CM | POA: Insufficient documentation

## 2021-04-14 LAB — CMP (CANCER CENTER ONLY)
ALT: 12 U/L (ref 0–44)
AST: 17 U/L (ref 15–41)
Albumin: 4.2 g/dL (ref 3.5–5.0)
Alkaline Phosphatase: 68 U/L (ref 38–126)
Anion gap: 7 (ref 5–15)
BUN: 17 mg/dL (ref 8–23)
CO2: 28 mmol/L (ref 22–32)
Calcium: 9.5 mg/dL (ref 8.9–10.3)
Chloride: 105 mmol/L (ref 98–111)
Creatinine: 0.9 mg/dL (ref 0.44–1.00)
GFR, Estimated: >60 mL/min (ref >60)
Glucose, Bld: 120 mg/dL — ABNORMAL HIGH (ref 70–99)
Potassium: 3.2 mmol/L — ABNORMAL LOW (ref 3.5–5.1)
Sodium: 140 mmol/L (ref 135–145)
Total Bilirubin: 2 mg/dL — ABNORMAL HIGH (ref 0.3–1.2)
Total Protein: 7.3 g/dL (ref 6.5–8.1)

## 2021-04-14 LAB — CBC WITH DIFFERENTIAL (CANCER CENTER ONLY)
Abs Immature Granulocytes: 0.02 10*3/uL (ref 0.00–0.07)
Basophils Absolute: 0.1 10*3/uL (ref 0.0–0.1)
Basophils Relative: 1 %
Eosinophils Absolute: 0.1 10*3/uL (ref 0.0–0.5)
Eosinophils Relative: 1 %
HCT: 36 % (ref 36.0–46.0)
Hemoglobin: 12.1 g/dL (ref 12.0–15.0)
Immature Granulocytes: 0 %
Lymphocytes Relative: 30 %
Lymphs Abs: 2.5 10*3/uL (ref 0.7–4.0)
MCH: 32.6 pg (ref 26.0–34.0)
MCHC: 33.6 g/dL (ref 30.0–36.0)
MCV: 97 fL (ref 80.0–100.0)
Monocytes Absolute: 0.6 10*3/uL (ref 0.1–1.0)
Monocytes Relative: 7 %
Neutro Abs: 4.9 10*3/uL (ref 1.7–7.7)
Neutrophils Relative %: 61 %
Platelet Count: 221 10*3/uL (ref 150–400)
RBC: 3.71 MIL/uL — ABNORMAL LOW (ref 3.87–5.11)
RDW: 13.1 % (ref 11.5–15.5)
WBC Count: 8.1 10*3/uL (ref 4.0–10.5)
nRBC: 0 % (ref 0.0–0.2)
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
## 2021-06-16 ENCOUNTER — Other Ambulatory Visit: Payer: Self-pay | Admitting: *Deleted

## 2021-06-16 MED ORDER — ANASTROZOLE 1 MG PO TABS: 1.0000 mg | ORAL_TABLET | Freq: Every day | ORAL | 3 refills | Status: DC

## 2021-06-30 ENCOUNTER — Telehealth: Payer: Self-pay | Admitting: Hematology and Oncology

## 2021-06-30 NOTE — Telephone Encounter (Signed)
.  Called patient to schedule appointment per 5/31 inbasket, patient is aware of date and time.   

## 2021-07-10 ENCOUNTER — Other Ambulatory Visit: Payer: Self-pay

## 2021-07-10 ENCOUNTER — Encounter: Payer: Self-pay | Admitting: Emergency Medicine

## 2021-07-10 ENCOUNTER — Ambulatory Visit
Admission: EM | Admit: 2021-07-10 | Discharge: 2021-07-10 | Disposition: A | Discharge status: Home / Self Care | Payer: Medicare Other | Attending: Family Medicine | Admitting: Family Medicine

## 2021-07-10 DIAGNOSIS — H05229 Edema of unspecified orbit: Secondary | ICD-10-CM | POA: Diagnosis not present

## 2021-07-10 DIAGNOSIS — I10 Essential (primary) hypertension: Secondary | ICD-10-CM | POA: Diagnosis not present

## 2021-07-10 MED ORDER — OLOPATADINE HCL 0.2 % OP SOLN: 1.0000 [drp] | Freq: Every day | OPHTHALMIC | 0 refills | Status: AC

## 2021-07-10 NOTE — ED Triage Notes (Signed)
Pt reports nasal swelling and bilateral eye swelling last night. Pt reports started using new eye drops and got new eye glasses a few days ago and reports is wondering if either of these are related.  NAD noted. Airway patent.Moderate swelling noted to nose.

## 2021-07-10 NOTE — Discharge Instructions (Signed)
Your blood pressure was noted to be elevated during your visit today. If you are currently taking medication for high blood pressure, please ensure you are taking this as directed. If you do not have a history of high blood pressure and your blood pressure remains persistently elevated, you may need to begin taking a medication at some point. You may return here within the next few days to recheck if unable to see your primary care provider or if you do not have a one.  BP (!) 171/80 (BP Location: Right Arm)   Pulse 79   Temp 98.3 F (36.8 C) (Oral)   Resp 18   SpO2 97%   BP Readings from Last 3 Encounters:  07/10/21 (!) 171/80  04/14/21 (!) 156/74  02/21/21 134/81

## 2021-07-12 NOTE — ED Provider Notes (Signed)
[place]   [id number] 07/10/21 Arrival Time: 4098  ASSESSMENT & PLAN:  1. Orbital swelling   2. Elevated blood pressure reading in office with diagnosis of hypertension    Mild swelling around/under eyes is not worsening. Unclear etiology. New eyeglasses may be putting too much pressure on bridge of nose. She is comfortable with observation. Ques allergy related this time of year. No signs of orbital cellulitis. May f/u with PCP to recheck BP.  Begin trial of: Discharge Medication List as of 07/10/2021 12:52 PM     START taking these medications   Details  Olopatadine HCl 0.2 % SOLN Apply 1 drop to eye daily., Starting Sat 07/10/2021, Normal         Follow-up Information     Jake Samples, PA-C.   Specialty: Family Medicine Why: As needed. Contact information: [address] [place] [address] [facility].   Specialty: Urgent Care Why: If worsening or failing to improve as anticipated. Contact information: [address], [place] [address] [phone]                Reviewed expectations re: course of current medical issues. Questions answered. Outlined signs and symptoms indicating need for more acute intervention. Understanding verbalized. After Visit Summary given.   SUBJECTIVE: History from: Patient. Meredith Donaldson is a 66 y.o. female. Reports: nasal swelling and mild swelling under eyes; noted last evening. New eyeglasses a few d ago but same style as previous. Occas mild eye itching. Denies: fever. Normal PO intake without n/v/d.  Increased blood pressure noted today. Reports that she is treated for HTN. She reports no chest pain on exertion, no dyspnea on exertion, no orthostatic dizziness or lightheadedness, no orthopnea or paroxysmal nocturnal dyspnea, and no palpitations.  OBJECTIVE:  Vitals:   07/10/21 1209  BP: (!) 171/80   Pulse: 79  Resp: 18  Temp: 98.3 F (36.8 C)  TempSrc: Oral  SpO2: 97%    General appearance: alert; no distress Eyes: PERRLA; EOMI; conjunctiva normal HENT: Will; AT; without nasal congestion; mild swelling under eyes and over bridge of nose; no overlying erythema; normal skin temp; PERRLA; EOMI Neck: supple  Lungs: speaks full sentences without difficulty; unlabored Extremities: no edema Skin: warm and dry Neurologic: normal gait Psychological: alert and cooperative; normal mood and affect   Allergies  Allergen Reactions   Penicillins Rash and Other (See Comments)    Has patient had a PCN reaction causing immediate rash, facial/tongue/throat swelling, SOB or lightheadedness with hypotension: Unknown Has patient had a PCN reaction causing severe rash involving mucus membranes or skin necrosis: Unknown Has patient had a PCN reaction that required hospitalization: Unknown Has patient had a PCN reaction occurring within the last 10 years: No If all of the above answers are "NO", then may proceed with Cephalosporin use.    Sulfa Antibiotics Rash    Past Medical History:  Diagnosis Date   Cancer ([place])    Cataracts, bilateral    Complication of anesthesia    Hyperlipidemia    Hypertension    Personal history of radiation therapy    left breast 2019   PONV (postoperative nausea and vomiting)    Social History   Socioeconomic History   Marital status: Widowed    Spouse name: Not on file   Number of children: Not on file   Years of education: Not on file  Highest education level: Not on file  Occupational History   Not on file  Tobacco Use   Smoking status: Never   Smokeless tobacco: Never  Vaping Use   Vaping Use: Never used  Substance and Sexual Activity   Alcohol use: No   Drug use: No   Sexual activity: Yes    Birth control/protection: None  Other Topics Concern   Not on file  Social History Narrative   Not on file   Social Determinants of Health    Financial Resource Strain: Not on file  Food Insecurity: Not on file  Transportation Needs: Not on file  Physical Activity: Not on file  Stress: Not on file  Social Connections: Not on file  Intimate Partner Violence: Not on file   Family History  Problem Relation Age of Onset   Heart attack Father    Breast cancer Cousin        maternal first cousin   Breast cancer Cousin        maternal first cousin   Cancer Neg Hx    Past Surgical History:  Procedure Laterality Date   BREAST BIOPSY Left 03/28/2017   BREAST BIOPSY Left 04/13/2017   BREAST LUMPECTOMY Left 05/10/2017   BREAST LUMPECTOMY WITH RADIOACTIVE SEED AND SENTINEL LYMPH NODE BIOPSY Left 05/10/2017   Procedure: BREAST LUMPECTOMY WITH RADIOACTIVE SEED X2 AND SENTINEL LYMPH NODE BIOPSY;  Surgeon: Fanny Skates, MD;  Location: [facility];  Service: General;  Laterality: Left;   CESAREAN SECTION     FRACTURE SURGERY Right 2008   right leg fracture s/p pinning     Vanessa Kick, MD 07/12/21 [phone]

## 2021-09-17 ENCOUNTER — Other Ambulatory Visit: Payer: Self-pay | Admitting: *Deleted

## 2021-09-17 NOTE — Patient Outreach (Signed)
  Care Coordination   09/17/2021 Name: Meredith Donaldson MRN: [id number] DOB: 1955/07/22   Care Coordination Outreach Attempts:  An unsuccessful telephone outreach was attempted today to offer the patient information about available care coordination services as a benefit of their health plan.   Follow Up Plan:  Additional outreach attempts will be made to offer the patient care coordination information and services.   Encounter Outcome:  No Answer  Care Coordination Interventions Activated:  No   Care Coordination Interventions:  No, not indicated    Valente David, RN, MSN, [facility] Care Management Care Management Coordinator [phone]

## 2021-09-22 ENCOUNTER — Other Ambulatory Visit: Payer: Self-pay | Admitting: *Deleted

## 2021-09-22 NOTE — Patient Outreach (Signed)
  Care Coordination   09/22/2021 Name: Meredith Donaldson MRN: [id number] DOB: 1955-02-27   Care Coordination Outreach Attempts:  A second unsuccessful outreach was attempted today to offer the patient with information about available care coordination services as a benefit of their health plan.     Follow Up Plan:  Additional outreach attempts will be made to offer the patient care coordination information and services.   Encounter Outcome:  No Answer  Care Coordination Interventions Activated:  No   Care Coordination Interventions:  No, not indicated    Valente David, RN, MSN, [facility] Care Management Care Management Coordinator [phone]

## 2021-09-24 ENCOUNTER — Other Ambulatory Visit: Payer: Self-pay | Admitting: *Deleted

## 2021-09-24 NOTE — Patient Outreach (Signed)
  Care Coordination   09/24/2021 Name: Meredith Donaldson MRN: [id number] DOB: [birth year]   Care Coordination Outreach Attempts:  A third unsuccessful outreach was attempted today to offer the patient with information about available care coordination services as a benefit of their health plan.   Follow Up Plan:  No further outreach attempts will be made at this time. We have been unable to contact the patient to offer or enroll patient in care coordination services  Encounter Outcome:  No Answer  Care Coordination Interventions Activated:  No   Care Coordination Interventions:  No, not indicated    Valente David, RN, MSN, [facility] Care Management Care Management Coordinator [phone]

## 2022-02-15 ENCOUNTER — Other Ambulatory Visit: Payer: Self-pay | Admitting: Hematology and Oncology

## 2022-02-15 DIAGNOSIS — Z1231 Encounter for screening mammogram for malignant neoplasm of breast: Secondary | ICD-10-CM

## 2022-03-24 ENCOUNTER — Encounter: Payer: Self-pay | Admitting: Radiology

## 2022-04-11 ENCOUNTER — Ambulatory Visit
Admission: RE | Admit: 2022-04-11 | Discharge: 2022-04-11 | Disposition: A | Discharge status: Home / Self Care | Payer: Medicare Other | Source: Ambulatory Visit | Attending: Hematology and Oncology | Admitting: Hematology and Oncology

## 2022-04-11 DIAGNOSIS — Z1231 Encounter for screening mammogram for malignant neoplasm of breast: Secondary | ICD-10-CM

## 2022-04-18 ENCOUNTER — Other Ambulatory Visit: Payer: Self-pay | Admitting: *Deleted

## 2022-04-18 DIAGNOSIS — Z170 Estrogen receptor positive status [ER+]: Secondary | ICD-10-CM

## 2022-04-20 ENCOUNTER — Inpatient Hospital Stay: Payer: Medicare Other

## 2022-04-20 ENCOUNTER — Other Ambulatory Visit: Payer: Self-pay

## 2022-04-20 ENCOUNTER — Inpatient Hospital Stay: Payer: Medicare Other | Attending: Hematology and Oncology | Admitting: Hematology and Oncology

## 2022-04-20 VITALS — BP 148/55 | HR 62 | Temp 97.8°F | Resp 16 | Ht 68.0 in | Wt 237.3 lb

## 2022-04-20 DIAGNOSIS — C50312 Malignant neoplasm of lower-inner quadrant of left female breast: Secondary | ICD-10-CM

## 2022-04-20 DIAGNOSIS — Z79899 Other long term (current) drug therapy: Secondary | ICD-10-CM | POA: Insufficient documentation

## 2022-04-20 DIAGNOSIS — Z170 Estrogen receptor positive status [ER+]: Secondary | ICD-10-CM

## 2022-04-20 DIAGNOSIS — Z79811 Long term (current) use of aromatase inhibitors: Secondary | ICD-10-CM | POA: Insufficient documentation

## 2022-04-20 DIAGNOSIS — M8580 Other specified disorders of bone density and structure, unspecified site: Secondary | ICD-10-CM | POA: Insufficient documentation

## 2022-04-20 DIAGNOSIS — C50212 Malignant neoplasm of upper-inner quadrant of left female breast: Secondary | ICD-10-CM | POA: Insufficient documentation

## 2022-04-20 DIAGNOSIS — Z923 Personal history of irradiation: Secondary | ICD-10-CM | POA: Diagnosis not present

## 2022-04-20 LAB — CBC WITH DIFFERENTIAL (CANCER CENTER ONLY)
Abs Immature Granulocytes: 0.03 10*3/uL (ref 0.00–0.07)
Basophils Absolute: 0.1 10*3/uL (ref 0.0–0.1)
Basophils Relative: 1 %
Eosinophils Absolute: 0.1 10*3/uL (ref 0.0–0.5)
Eosinophils Relative: 1 %
HCT: 36.8 % (ref 36.0–46.0)
Hemoglobin: 12.6 g/dL (ref 12.0–15.0)
Immature Granulocytes: 0 %
Lymphocytes Relative: 23 %
Lymphs Abs: 2.4 10*3/uL (ref 0.7–4.0)
MCH: 33.2 pg (ref 26.0–34.0)
MCHC: 34.2 g/dL (ref 30.0–36.0)
MCV: 96.8 fL (ref 80.0–100.0)
Monocytes Absolute: 0.5 10*3/uL (ref 0.1–1.0)
Monocytes Relative: 5 %
Neutro Abs: 7.3 10*3/uL (ref 1.7–7.7)
Neutrophils Relative %: 70 %
Platelet Count: 233 10*3/uL (ref 150–400)
RBC: 3.8 MIL/uL — ABNORMAL LOW (ref 3.87–5.11)
RDW: 12.9 % (ref 11.5–15.5)
WBC Count: 10.3 10*3/uL (ref 4.0–10.5)
nRBC: 0 % (ref 0.0–0.2)

## 2022-04-20 LAB — CMP (CANCER CENTER ONLY)
ALT: 12 U/L (ref 0–44)
AST: 17 U/L (ref 15–41)
Albumin: 4.2 g/dL (ref 3.5–5.0)
Alkaline Phosphatase: 60 U/L (ref 38–126)
Anion gap: 7 (ref 5–15)
BUN: 17 mg/dL (ref 8–23)
CO2: 29 mmol/L (ref 22–32)
Calcium: 9.7 mg/dL (ref 8.9–10.3)
Chloride: 105 mmol/L (ref 98–111)
Creatinine: 0.94 mg/dL (ref 0.44–1.00)
GFR, Estimated: >60 mL/min (ref >60)
Glucose, Bld: 117 mg/dL — ABNORMAL HIGH (ref 70–99)
Potassium: 3.2 mmol/L — ABNORMAL LOW (ref 3.5–5.1)
Sodium: 141 mmol/L (ref 135–145)
Total Bilirubin: 2.4 mg/dL — ABNORMAL HIGH (ref 0.3–1.2)
Total Protein: 7.4 g/dL (ref 6.5–8.1)

## 2022-04-20 MED ORDER — ANASTROZOLE 1 MG PO TABS: 1.0000 mg | ORAL_TABLET | Freq: Every day | ORAL | 0 refills | Status: DC

## 2022-04-20 NOTE — Progress Notes (Signed)
[facility]  [phone] [phone]     ID: CHANNA STINNER DOB: 15-Aug-1955  MR#: [id number]  [id number]  Patient Care Team: Scherrie Bateman as PCP - General (Family Medicine) Magrinat, Virgie Dad, MD (Inactive) as Consulting Physician (Oncology) Fanny Skates, MD as Consulting Physician (General Surgery) Kyung Rudd, MD as Consulting Physician (Radiation Oncology) Delice Bison, Charlestine Massed, NP as Nurse Practitioner (Hematology and Oncology) OTHER MD:   CHIEF COMPLAINT: Estrogen receptor positive breast cancer  CURRENT TREATMENT: Anastrozole   INTERVAL HISTORY: Meredith Donaldson returns today for follow-up of her estrogen receptor positive breast cancer.  She is tolerating anastrazole well. No complaints or adverse effects. She is excited to be done with anastrozole this summer.  Since her last visit here, she lost Romeo her dog who is 68 years old, had dilated cardiomyopathy.  She talked about how she misses him and about her other pets today.  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 Last mammogram March 2024, negative for malignancy. Rest of the pertinent 10 point ROS reviewed and negative   COVID 19 VACCINATION STATUS: Status post [place] x2 with booster November 2021   HISTORY OF CURRENT ILLNESS: From the original intake note:  Meredith Donaldson had routine screening mammography on 03/03/2017 showing a possible abnormality in the left breast. She underwent unilateral left diagnostic mammography with tomography and left breast ultrasonography at [facility] on 03/14/2017 showing: breast density category B. There are were multiple hypoechoic masses of varying sizes in the lower inner quadrant of the left breast found sonographically. A dominant mass at the 7:30 position measuring 0.9 x 0.9 x 0.8 cm near the inframammary fold. Another mass at the 7:30 slightly deep and lateral measuring 0.4 x 0.3 cm. At 8 o'clock position, near the  medial margin of the breast is a second dominant mass measuring 0.8 x 0.8 x 0.9 cm. Slightly medial to this is a similar-appearing hypoechoic mass measuring 0.4 x 0.4 x 0.5 cm. Immediately lateral to the dominant mass at 8:00 position, located 12 cm from the nipple, is a 0.4 x 0.4 x 0.5 cm mass. Slightly more superficially at 8 o'clock position 12 cm from the nipple is an irregular hypoechoic mass measuring 0.7 cm maximum diameter. Ultrasound of the left axilla was negative for lymphadenopathy.   Accordingly on 03/28/2017 she proceeded to biopsy of the left breast area in question. The pathology from this procedure showed [id number]): At the 8 o'clock position, invasive ductal carcinoma with papillary features. Prognostic indicators significant for: estrogen receptor, 100% positive and progesterone receptor, 100% positive, both with strong staining intensity. Proliferation marker Ki67 at 2%. HER2 not amplified with ratios HER2/CEP17 signals 1.13 and average HER2 copies per cell 1.80  On 04/10/2017 the patient had bilateral breast MRI, showing an area up to 5.4 cm including 2 separate masses and an area of non-masslike enhancement.  The mass that was previously biopsied is the more posterior one, near the inframammary fold.  The more anterior mass has not been biopsied and if lumpectomy is considered biopsying that mass would allow the entire span of abnormal enhancement to be included in the lumpectomy specimen.  This second MRI guided biopsy has been scheduled for 04/17/2017.  The right breast was unremarkable and there were no abnormal looking lymph nodes  The patient's subsequent history is as detailed below.   PAST MEDICAL HISTORY: Past Medical History:  Diagnosis Date   Cancer ([place])    Cataracts, bilateral  Complication of anesthesia    Hyperlipidemia    Hypertension    Personal history of radiation therapy    left breast 2019   PONV (postoperative nausea and vomiting)     PAST  SURGICAL HISTORY: Past Surgical History:  Procedure Laterality Date   BREAST BIOPSY Left 03/28/2017   BREAST BIOPSY Left 04/13/2017   BREAST LUMPECTOMY Left 05/10/2017   BREAST LUMPECTOMY WITH RADIOACTIVE SEED AND SENTINEL LYMPH NODE BIOPSY Left 05/10/2017   Procedure: BREAST LUMPECTOMY WITH RADIOACTIVE SEED X2 AND SENTINEL LYMPH NODE BIOPSY;  Surgeon: Fanny Skates, MD;  Location: [facility];  Service: General;  Laterality: Left;   CESAREAN SECTION     FRACTURE SURGERY Right 2008   right leg fracture s/p pinning    FAMILY HISTORY Family History  Problem Relation Age of Onset   Heart attack Father    Breast cancer Cousin        maternal first cousin   Breast cancer Cousin        maternal first cousin   Cancer Neg Hx    The patient's father died at age 6 due to several heart attacks and stokes. The patient's mother passed at age 68 due to old age. The patient has 4 brothers and 2 sisters. She denies a family history of breast or ovarian cancer.   GYNECOLOGIC HISTORY:  No LMP recorded. Patient is postmenopausal. Menarche: 67 years old Age at first live birth: 67 years old The patient is GX P2 LMP: at age 1 She took low dose oral contraceptive pills briefly after her 1st birth, with no complications. She had bilateral tubal ligation after her 2nd birth due to pregnancy complications. She retained her ovaries. She did not take HRT.    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 Her husband passed away some years ago due to cancer (lymphoma). She lives with her significant other of 7+ years, [name], who is retired from being a Furniture conservator/restorer. [name] also helps with farm work. The patient's oldest son, [name] "Corene Cornea" Salts is a Company secretary at [employer]. The patient's second son, "Christia Reading" Tattiana Reynosa, was a Engineer, building services but is now a Librarian, academic and lives near the patient's farm. The patient has 7 grandchildren.  She has  one [employer] and a 120 pound [employer], that she obtained after 3 people broke into 1 of her trucks.  She also has multiple chickens and 4 peacocks. She does not attend a church.      ADVANCED DIRECTIVES:    HEALTH MAINTENANCE: Social History   Tobacco Use   Smoking status: Never   Smokeless tobacco: Never  Vaping Use   Vaping Use: Never used  Substance Use Topics   Alcohol use: No   Drug use: No     Colonoscopy: never  PAP: [place]/ not UTD  Bone density: 09/2017, -0.8    Allergies  Allergen Reactions   Penicillins Rash and Other (See Comments)    Has patient had a PCN reaction causing immediate rash, facial/tongue/throat swelling, SOB or lightheadedness with hypotension: Unknown Has patient had a PCN reaction causing severe rash involving mucus membranes or skin necrosis: Unknown Has patient had a PCN reaction that required hospitalization: Unknown Has patient had a PCN reaction occurring within the last 10 years: No If all of the above answers are "NO", then may proceed with Cephalosporin use.    Sulfa Antibiotics Rash  Current Outpatient Medications  Medication Sig Dispense Refill   acetaminophen (TYLENOL) 500 MG tablet Take 1,000 mg by mouth every 6 (six) hours as needed for moderate pain or headache.      anastrozole (ARIMIDEX) 1 MG tablet Take 1 tablet (1 mg total) by mouth daily. 90 tablet 3   bisoprolol-hydrochlorothiazide (ZIAC) 10-6.25 MG tablet Take 1 tablet by mouth daily.  1   cholecalciferol (VITAMIN D3) 25 MCG (1000 UNIT) tablet Take 1 tablet (1,000 Units total) by mouth daily. (Patient not taking: No sig reported) 100 tablet 3   lisinopril (ZESTRIL) 10 MG tablet Take 1 tablet (10 mg total) by mouth daily.     Olopatadine HCl 0.2 % SOLN Apply 1 drop to eye daily. 2.5 mL 0   Omega-3 Fatty Acids (FISH OIL) 1200 MG CAPS Take 2,400 mg by mouth daily before lunch.      vitamin C (ASCORBIC ACID) 500 MG tablet Take 500 mg by mouth daily  before lunch.      vitamin E 400 UNIT capsule Take 400 Units by mouth daily before lunch.     No current facility-administered medications for this visit.    OBJECTIVE: White woman who appears well  There were no vitals filed for this visit.  Wt Readings from Last 3 Encounters:  04/14/21 240 lb 9.6 oz (109.1 kg)  05/05/20 237 lb (107.5 kg)  04/14/20 237 lb 14.4 oz (107.9 kg)   There is no height or weight on file to calculate BMI.    ECOG FS:1 - Symptomatic but completely ambulatory  Sclerae unicteric, EOMs intact Wearing a mask No cervical or supraclavicular adenopathy Lungs no rales or rhonchi Heart regular rate and rhythm Abd soft, nontender, positive bowel sounds MSK no focal spinal tenderness, no upper extremity lymphedema Neuro: nonfocal, well oriented, appropriate affect Breasts: Bilateral breasts inspected examined. No palpable regional adenopathy Left breast s/p radiation changes.  LAB RESULTS:  CMP     Component Value Date/Time   NA 140 04/14/2021 0857   K 3.2 (L) 04/14/2021 0857   CL 105 04/14/2021 0857   CO2 28 04/14/2021 0857   GLUCOSE 120 (H) 04/14/2021 0857   BUN 17 04/14/2021 0857   CREATININE 0.90 04/14/2021 0857   CALCIUM 9.5 04/14/2021 0857   PROT 7.3 04/14/2021 0857   ALBUMIN 4.2 04/14/2021 0857   AST 17 04/14/2021 0857   ALT 12 04/14/2021 0857   ALKPHOS 68 04/14/2021 0857   BILITOT 2.0 (H) 04/14/2021 0857   GFRNONAA >60 04/14/2021 0857   GFRAA >60 04/15/2019 0952   GFRAA >60 12/28/2017 0941    No results found for: "TOTALPROTELP", "ALBUMINELP", "A1GS", "A2GS", "BETS", "BETA2SER", "GAMS", "MSPIKE", "SPEI"  No results found for: "KPAFRELGTCHN", "LAMBDASER", "KAPLAMBRATIO"  Lab Results  Component Value Date   WBC 8.1 04/14/2021   NEUTROABS 4.9 04/14/2021   HGB 12.1 04/14/2021   HCT 36.0 04/14/2021   MCV 97.0 04/14/2021   PLT 221 04/14/2021   No results found for: "LABCA2"  No components found for: "[id number]"  No results for  input(s): "INR" in the last 168 hours.  No results found for: "LABCA2"  No results found for: "[id number]"  No results found for: "CAN125"  No results found for: "CAN153"  No results found for: "CA2729"  No components found for: "HGQUANT"  No results found for: "CEA1", "CEA" / No results found for: "CEA1", "CEA"   No results found for: "AFPTUMOR"  No results found for: "CHROMOGRNA"  No results found for: "HGBA", "HGBA2QUANT", "HGBFQUANT", "  HGBSQUAN" (Hemoglobinopathy evaluation)   No results found for: "LDH"  No results found for: "IRON", "TIBC", "IRONPCTSAT" (Iron and TIBC)  No results found for: "FERRITIN"  Urinalysis    Component Value Date/Time   COLORURINE YELLOW 07/29/2017 [place] 07/29/2017 1231   LABSPEC 1.010 07/29/2017 1231   PHURINE 5.0 07/29/2017 1231   GLUCOSEU NEGATIVE 07/29/2017 1231   HGBUR NEGATIVE 07/29/2017 1231   BILIRUBINUR NEGATIVE 07/29/2017 [place] 07/29/2017 1231   PROTEINUR NEGATIVE 07/29/2017 1231   NITRITE NEGATIVE 07/29/2017 [place] 07/29/2017 1231    STUDIES: MM 3D SCREEN BREAST BILATERAL  Result Date: 04/12/2022 CLINICAL DATA:  Screening. EXAM: DIGITAL SCREENING BILATERAL MAMMOGRAM WITH TOMOSYNTHESIS AND CAD TECHNIQUE: Bilateral screening digital craniocaudal and mediolateral oblique mammograms were obtained. Bilateral screening digital breast tomosynthesis was performed. The images were evaluated with computer-aided detection. COMPARISON:  Previous exam(s). ACR Breast Density Category b: There are scattered areas of fibroglandular density. FINDINGS: There are no findings suspicious for malignancy. IMPRESSION: No mammographic evidence of malignancy. A result letter of this screening mammogram will be mailed directly to the patient. RECOMMENDATION: Screening mammogram in one year. (Code:SM-B-01Y) BI-RADS CATEGORY  1: Negative. Electronically Signed   By: Abelardo Diesel M.D.   On:  04/12/2022 13:06     ELIGIBLE FOR AVAILABLE RESEARCH PROTOCOL: no  ASSESSMENT: 67 y.o.  Tibbie Tsaile woman status post left breast lower inner quadrant biopsy 03/28/2017 for a clinical mT1b N0, stage I invasive ductal carcinoma, grade 2, estrogen and progesterone receptor strongly positive, with no HER-2 amplification, and an MIB-1 of 2%.  (1) status post left lumpectomy and sentinel lymph node sampling 05/10/2017 for an mpT2 pN0, stage IB invasive ductal carcinoma, grade 1, with negative margins.  (a) a total of 3 sentinel lymph nodes were removed  (2) The Oncotype DX score was 14, predicting a risk of outside the breast recurrence over the next 10 years of 4% if the patient's only systemic therapy is tamoxifen for 5 years.  It also predicts no significant benefit from chemotherapy.  (3) adjuvant radiation completed 07/21/2017  (4) anastrozole started 07/24/2017  (a) bone density 10/03/2017 showed a T score of -0.8 (normal).  (b) bone density 04/08/2020 shows a T score of -1.1   PLAN:  Ms. Parden is here for follow-up on adjuvant anastrozole.  She will be completing 5 years of anastrozole in July 2024. Last mammogram April 11, 2022, breast density category B, no mammographic evidence of malignancy. She had bone density in March 2022 which showed a T score of -1.1, very mild osteopenia hence was recommended calcium and vitamin D supplementation.  She has been taking the vitamin D. Physical examination today without any concerns for recurrence, left breast postradiation changes appreciated, no palpable masses.  No regional adenopathy. She will repeat mammogram in March 2025.  She can stop antiestrogen therapy this summer.  She will return to clinic in 1 year or PRN.  She was instructed to call us with any new questions or concerns.    Total encounter time 30 minutes.*  *Total Encounter Time as defined by the Centers for Medicare and Medicaid Services includes, in addition to the  face-to-face time of a patient visit (documented in the note above) non-face-to-face time: obtaining and reviewing outside history, ordering and reviewing medications, tests or procedures, care coordination (communications with other health care professionals or caregivers) and documentation in the medical record.

## 2022-06-20 ENCOUNTER — Other Ambulatory Visit: Payer: Self-pay | Admitting: Hematology and Oncology

## 2023-02-03 ENCOUNTER — Other Ambulatory Visit: Payer: Self-pay | Admitting: Hematology and Oncology

## 2023-02-03 DIAGNOSIS — Z1231 Encounter for screening mammogram for malignant neoplasm of breast: Secondary | ICD-10-CM

## 2023-04-12 ENCOUNTER — Ambulatory Visit
Admission: RE | Admit: 2023-04-12 | Discharge: 2023-04-12 | Disposition: A | Discharge status: Home / Self Care | Payer: Medicare Other | Source: Ambulatory Visit | Attending: Hematology and Oncology | Admitting: Hematology and Oncology

## 2023-04-12 DIAGNOSIS — Z1231 Encounter for screening mammogram for malignant neoplasm of breast: Secondary | ICD-10-CM

## 2023-04-24 ENCOUNTER — Inpatient Hospital Stay: Payer: Medicare Other | Attending: Hematology and Oncology | Admitting: Hematology and Oncology

## 2023-04-24 VITALS — BP 169/65 | HR 73 | Temp 98.3°F | Resp 16 | Wt 224.9 lb

## 2023-04-24 DIAGNOSIS — Z1231 Encounter for screening mammogram for malignant neoplasm of breast: Secondary | ICD-10-CM

## 2023-04-24 DIAGNOSIS — Z79899 Other long term (current) drug therapy: Secondary | ICD-10-CM | POA: Insufficient documentation

## 2023-04-24 DIAGNOSIS — Z170 Estrogen receptor positive status [ER+]: Secondary | ICD-10-CM

## 2023-04-24 DIAGNOSIS — Z923 Personal history of irradiation: Secondary | ICD-10-CM | POA: Insufficient documentation

## 2023-04-24 DIAGNOSIS — C50312 Malignant neoplasm of lower-inner quadrant of left female breast: Secondary | ICD-10-CM

## 2023-04-24 DIAGNOSIS — Z803 Family history of malignant neoplasm of breast: Secondary | ICD-10-CM | POA: Diagnosis not present

## 2023-04-24 DIAGNOSIS — Z853 Personal history of malignant neoplasm of breast: Secondary | ICD-10-CM | POA: Insufficient documentation

## 2023-04-24 NOTE — Progress Notes (Signed)
 [facility]  [phone] [phone]     ID: Meredith Donaldson DOB: Feb 16, 1955  MR#: [id number]  JYN#:[id number]  Patient Care Team: Ladon Applebaum as PCP - General (Family Medicine) Magrinat, Valentino Hue, MD (Inactive) as Consulting Physician (Oncology) Claud Kelp, MD as Consulting Physician (General Surgery) Dorothy Puffer, MD as Consulting Physician (Radiation Oncology) Axel Filler, Larna Daughters, NP as Nurse Practitioner (Hematology and Oncology) OTHER MD:   CHIEF COMPLAINT: Estrogen receptor positive breast cancer  CURRENT TREATMENT: Observation   INTERVAL HISTORY: Meredith Donaldson returns today for follow-up of her estrogen receptor positive breast cancer.   She has a history of breast cancer and stopped taking her anastrozole last year. Despite this, she has no new symptoms or concerns related to breast cancer. Recent mammogram results were normal.  Over the past year, she has faced significant personal stressors, including the loss of her partner in November 2024. She feels she is managing okay despite these challenges. She has a supportive family, including a son who is concerned about her well-being, and she finds companionship in her pets, including a rescue [place] and a cat.  Her partner had leukemia, which was a prolonged and difficult experience. He underwent chemotherapy and frequent blood transfusions, but the treatment was not well-tolerated. After about seven months, he decided to stop treatment and passed away shortly after entering hospice care.  She is currently undergoing dental work and recently had three teeth extracted, although she was initially told it would be four. She had her stitches removed last Thursday and is wearing a mask as a precaution, not due to illness.  Last mammogram March 2025 negative for malignancy.  Rest of the pertinent 10 point ROS reviewed and negative   COVID 19 VACCINATION STATUS: Status post  Pfizer x2 with booster November 2021   HISTORY OF CURRENT ILLNESS: From the original intake note:  Meredith Donaldson had routine screening mammography on 03/03/2017 showing a possible abnormality in the left breast. She underwent unilateral left diagnostic mammography with tomography and left breast ultrasonography at [facility] on 03/14/2017 showing: breast density category B. There are were multiple hypoechoic masses of varying sizes in the lower inner quadrant of the left breast found sonographically. A dominant mass at the 7:30 position measuring 0.9 x 0.9 x 0.8 cm near the inframammary fold. Another mass at the 7:30 slightly deep and lateral measuring 0.4 x 0.3 cm. At 8 o'clock position, near the medial margin of the breast is a second dominant mass measuring 0.8 x 0.8 x 0.9 cm. Slightly medial to this is a similar-appearing hypoechoic mass measuring 0.4 x 0.4 x 0.5 cm. Immediately lateral to the dominant mass at 8:00 position, located 12 cm from the nipple, is a 0.4 x 0.4 x 0.5 cm mass. Slightly more superficially at 8 o'clock position 12 cm from the nipple is an irregular hypoechoic mass measuring 0.7 cm maximum diameter. Ultrasound of the left axilla was negative for lymphadenopathy.   Accordingly on 03/28/2017 she proceeded to biopsy of the left breast area in question. The pathology from this procedure showed ([id number]): At the 8 o'clock position, invasive ductal carcinoma with papillary features. Prognostic indicators significant for: estrogen receptor, 100% positive and progesterone receptor, 100% positive, both with strong staining intensity. Proliferation marker Ki67 at 2%. HER2 not amplified with ratios HER2/CEP17 signals 1.13 and average HER2 copies per cell 1.80  On 04/10/2017 the patient had bilateral breast MRI, showing an area up to 5.4 cm including 2  separate masses and an area of non-masslike enhancement.  The mass that was previously biopsied is the more posterior one,  near the inframammary fold.  The more anterior mass has not been biopsied and if lumpectomy is considered biopsying that mass would allow the entire span of abnormal enhancement to be included in the lumpectomy specimen.  This second MRI guided biopsy has been scheduled for 04/17/2017.  The right breast was unremarkable and there were no abnormal looking lymph nodes  The patient's subsequent history is as detailed below.   PAST MEDICAL HISTORY: Past Medical History:  Diagnosis Date   Cancer (HCC)    Cataracts, bilateral    Complication of anesthesia    Hyperlipidemia    Hypertension    Personal history of radiation therapy    left breast 2019   PONV (postoperative nausea and vomiting)     PAST SURGICAL HISTORY: Past Surgical History:  Procedure Laterality Date   BREAST BIOPSY Left 03/28/2017   BREAST BIOPSY Left 04/13/2017   BREAST LUMPECTOMY Left 05/10/2017   BREAST LUMPECTOMY WITH RADIOACTIVE SEED AND SENTINEL LYMPH NODE BIOPSY Left 05/10/2017   Procedure: BREAST LUMPECTOMY WITH RADIOACTIVE SEED X2 AND SENTINEL LYMPH NODE BIOPSY;  Surgeon: Claud Kelp, MD;  Location: [facility] OR;  Service: General;  Laterality: Left;   CESAREAN SECTION     FRACTURE SURGERY Right 2008   right leg fracture s/p pinning    FAMILY HISTORY Family History  Problem Relation Age of Onset   Heart attack Father    Breast cancer Cousin        maternal first cousin   Breast cancer Cousin        maternal first cousin   Cancer Neg Hx    The patient's father died at age 77 due to several heart attacks and stokes. The patient's mother passed at age 55 due to old age. The patient has 4 brothers and 2 sisters. She denies a family history of breast or ovarian cancer.   GYNECOLOGIC HISTORY:  No LMP recorded. Patient is postmenopausal. Menarche: 68 years old Age at first live birth: 68 years old The patient is GX P2 LMP: at age 68 She took low dose oral contraceptive pills briefly after her 1st birth,  with no complications. She had bilateral tubal ligation after her 2nd birth due to pregnancy complications. She retained her ovaries. She did not take HRT.    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 Her husband passed away some years ago due to cancer (lymphoma). She lives with her significant other of 7+ years, Rayna Sexton, who is retired from being a Chartered certified accountant. Rayna Sexton also helps with farm work. The patient's oldest son, Everlena Cooper "Barbara Cower" Trani is a Optician, dispensing at [employer]. The patient's second son, "Marcial Pacas" Madilynn Montante, was a Games developer but is now a Merchandiser, retail and lives near the patient's farm. The patient has 7 grandchildren.  She has one [employer] and a 120 pound [employer], that she obtained after 3 people broke into 1 of her trucks.  She also has multiple chickens and 4 peacocks. She does not attend a church.      ADVANCED DIRECTIVES:    HEALTH MAINTENANCE: Social History   Tobacco Use   Smoking status: Never   Smokeless tobacco: Never  Vaping Use   Vaping status: Never Used  Substance Use Topics   Alcohol use: No   Drug  use: No     Colonoscopy: never  PAP: [place]/ not UTD  Bone density: 09/2017, -0.8    Allergies  Allergen Reactions   Penicillins Rash and Other (See Comments)    Has patient had a PCN reaction causing immediate rash, facial/tongue/throat swelling, SOB or lightheadedness with hypotension: Unknown Has patient had a PCN reaction causing severe rash involving mucus membranes or skin necrosis: Unknown Has patient had a PCN reaction that required hospitalization: Unknown Has patient had a PCN reaction occurring within the last 10 years: No If all of the above answers are "NO", then may proceed with Cephalosporin use.    Sulfa Antibiotics Rash    Current Outpatient Medications  Medication Sig Dispense Refill   acetaminophen (TYLENOL) 500 MG tablet Take 1,000  mg by mouth every 6 (six) hours as needed for moderate pain or headache.      anastrozole (ARIMIDEX) 1 MG tablet Take 1 tablet by mouth once daily 30 tablet 0   bisoprolol-hydrochlorothiazide (ZIAC) 10-6.25 MG tablet Take 1 tablet by mouth daily.  1   cholecalciferol (VITAMIN D3) 25 MCG (1000 UNIT) tablet Take 1 tablet (1,000 Units total) by mouth daily. (Patient not taking: No sig reported) 100 tablet 3   lisinopril (ZESTRIL) 10 MG tablet Take 1 tablet (10 mg total) by mouth daily.     Olopatadine HCl 0.2 % SOLN Apply 1 drop to eye daily. 2.5 mL 0   Omega-3 Fatty Acids (FISH OIL) 1200 MG CAPS Take 2,400 mg by mouth daily before lunch.      vitamin C (ASCORBIC ACID) 500 MG tablet Take 500 mg by mouth daily before lunch.      vitamin E 400 UNIT capsule Take 400 Units by mouth daily before lunch.     No current facility-administered medications for this visit.    OBJECTIVE: White woman who appears well  Vitals:   04/24/23 1146  BP: (!) 169/65  Pulse: 73  Resp: 16  Temp: 98.3 F (36.8 C)  SpO2: 98%    Wt Readings from Last 3 Encounters:  04/24/23 224 lb 14.4 oz (102 kg)  04/20/22 237 lb 4.8 oz (107.6 kg)  04/14/21 240 lb 9.6 oz (109.1 kg)   Body mass index is 34.2 kg/m.    ECOG FS:1 - Symptomatic but completely ambulatory  Sclerae unicteric, EOMs intact Wearing a mask No cervical or supraclavicular adenopathy Breasts: Bilateral breasts inspected examined. No masses.No palpable regional adenopathy Left breast s/p radiation changes.  LAB RESULTS:  CMP     Component Value Date/Time   NA 141 04/20/2022 1022   K 3.2 (L) 04/20/2022 1022   CL 105 04/20/2022 1022   CO2 29 04/20/2022 1022   GLUCOSE 117 (H) 04/20/2022 1022   BUN 17 04/20/2022 1022   CREATININE 0.94 04/20/2022 1022   CALCIUM 9.7 04/20/2022 1022   PROT 7.4 04/20/2022 1022   ALBUMIN 4.2 04/20/2022 1022   AST 17 04/20/2022 1022   ALT 12 04/20/2022 1022   ALKPHOS 60 04/20/2022 1022   BILITOT 2.4 (H)  04/20/2022 1022   GFRNONAA >60 04/20/2022 1022   GFRAA >60 04/15/2019 0952   GFRAA >60 12/28/2017 0941    No results found for: "TOTALPROTELP", "ALBUMINELP", "A1GS", "A2GS", "BETS", "BETA2SER", "GAMS", "MSPIKE", "SPEI"  No results found for: "KPAFRELGTCHN", "LAMBDASER", "KAPLAMBRATIO"  Lab Results  Component Value Date   WBC 10.3 04/20/2022   NEUTROABS 7.3 04/20/2022   HGB 12.6 04/20/2022   HCT 36.8 04/20/2022   MCV 96.8 04/20/2022  PLT 233 04/20/2022   No results found for: "LABCA2"  No components found for: "RJJOAC166"  No results for input(s): "INR" in the last 168 hours.  No results found for: "LABCA2"  No results found for: "AYT016"  No results found for: "CAN125"  No results found for: "CAN153"  No results found for: "CA2729"  No components found for: "HGQUANT"  No results found for: "CEA1", "CEA" / No results found for: "CEA1", "CEA"   No results found for: "AFPTUMOR"  No results found for: "CHROMOGRNA"  No results found for: "HGBA", "HGBA2QUANT", "HGBFQUANT", "HGBSQUAN" (Hemoglobinopathy evaluation)   No results found for: "LDH"  No results found for: "IRON", "TIBC", "IRONPCTSAT" (Iron and TIBC)  No results found for: "FERRITIN"  Urinalysis    Component Value Date/Time   COLORURINE YELLOW 07/29/2017 1231   APPEARANCEUR CLEAR 07/29/2017 1231   LABSPEC 1.010 07/29/2017 1231   PHURINE 5.0 07/29/2017 1231   GLUCOSEU NEGATIVE 07/29/2017 1231   HGBUR NEGATIVE 07/29/2017 1231   BILIRUBINUR NEGATIVE 07/29/2017 1231   KETONESUR NEGATIVE 07/29/2017 1231   PROTEINUR NEGATIVE 07/29/2017 1231   NITRITE NEGATIVE 07/29/2017 1231   LEUKOCYTESUR NEGATIVE 07/29/2017 1231    STUDIES: MM 3D SCREENING MAMMOGRAM BILATERAL BREAST Result Date: 04/14/2023 CLINICAL DATA:  Screening. EXAM: DIGITAL SCREENING BILATERAL MAMMOGRAM WITH TOMOSYNTHESIS AND CAD TECHNIQUE: Bilateral screening digital craniocaudal and mediolateral oblique mammograms were obtained. Bilateral  screening digital breast tomosynthesis was performed. The images were evaluated with computer-aided detection. COMPARISON:  Previous exam(s). ACR Breast Density Category b: There are scattered areas of fibroglandular density. FINDINGS: There are no findings suspicious for malignancy. IMPRESSION: No mammographic evidence of malignancy. A result letter of this screening mammogram will be mailed directly to the patient. RECOMMENDATION: Screening mammogram in one year. (Code:SM-B-01Y) BI-RADS CATEGORY  1: Negative. Electronically Signed   By: Ted Mcalpine M.D.   On: 04/14/2023 08:34     ELIGIBLE FOR AVAILABLE RESEARCH PROTOCOL: no  ASSESSMENT: 68 y.o.  [place] woman status post left breast lower inner quadrant biopsy 03/28/2017 for a clinical mT1b N0, stage I invasive ductal carcinoma, grade 2, estrogen and progesterone receptor strongly positive, with no HER-2 amplification, and an MIB-1 of 2%.  (1) status post left lumpectomy and sentinel lymph node sampling 05/10/2017 for an mpT2 pN0, stage IB invasive ductal carcinoma, grade 1, with negative margins.  (a) a total of 3 sentinel lymph nodes were removed  (2) The Oncotype DX score was 14, predicting a risk of outside the breast recurrence over the next 10 years of 4% if the patient's only systemic therapy is tamoxifen for 5 years.  It also predicts no significant benefit from chemotherapy.  (3) adjuvant radiation completed 07/21/2017  (4) anastrozole started 07/24/2017  (a) bone density 10/03/2017 showed a T score of -0.8 (normal).  (b) bone density 04/08/2020 shows a T score of -1.1             (C) She completed 5 yrs of anastrozole in July 2024   PLAN:  Breast cancer follow-up She completed 5 yrs of anastrozole in 2024.Recent mammogram results were favorable, and today's physical examination revealed no lumps or lymphadenopathy. She is currently not on any specific cancer treatment and is being monitored for recurrence. -  Schedule follow-up appointment in one year - Advise to call if any concerns arise before the next scheduled visit  Grief and stress She is experiencing grief and stress following the loss of her partner in November 2024. Her partner had leukemia and underwent  extensive treatment, including chemotherapy and blood transfusions, but ultimately decided to stop treatment due to the severity of side effects and poor prognosis. She reports feeling under stress but is managing with the support of her pets and family, including her son and grandchildren. - Encourage continued engagement with supportive family and friends - Consider referral to counseling services if stress becomes unmanageable  Dental work She recently underwent dental procedures, including the extraction of three teeth, and had her stitches removed last Thursday. She is currently recovering from the dental work and is wearing a mask as a precautionary measure. - Advise to follow up with her dentist as neede    Total encounter time 30 minutes.*  *Total Encounter Time as defined by the Centers for Medicare and Medicaid Services includes, in addition to the face-to-face time of a patient visit (documented in the note above) non-face-to-face time: obtaining and reviewing outside history, ordering and reviewing medications, tests or procedures, care coordination (communications with other health care professionals or caregivers) and documentation in the medical record.

## 2023-04-24 NOTE — Progress Notes (Signed)
 [facility]  [phone] [phone]     ID: Meredith Donaldson DOB: 1955-09-21  MR#: [id number]  UXL#:[id number]  Patient Care Team: Ladon Applebaum as PCP - General (Family Medicine) Magrinat, Valentino Hue, MD (Inactive) as Consulting Physician (Oncology) Claud Kelp, MD as Consulting Physician (General Surgery) Dorothy Puffer, MD as Consulting Physician (Radiation Oncology) Axel Filler, Larna Daughters, NP as Nurse Practitioner (Hematology and Oncology) OTHER MD:   CHIEF COMPLAINT: Estrogen receptor positive breast cancer  CURRENT TREATMENT: Anastrozole   INTERVAL HISTORY: Meredith Donaldson returns today for follow-up of her estrogen receptor positive breast cancer.  She is tolerating anastrazole well. No complaints or adverse effects. She is excited to be done with anastrozole this summer.  Since her last visit here, she lost Romeo her dog who is 34 years old, had dilated cardiomyopathy.  She talked about how she misses him and about her other pets today.  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 Last mammogram March 2024, negative for malignancy. Rest of the pertinent 10 point ROS reviewed and negative   COVID 19 VACCINATION STATUS: Status post Pfizer x2 with booster November 2021   HISTORY OF CURRENT ILLNESS: From the original intake note:  Meredith Donaldson had routine screening mammography on 03/03/2017 showing a possible abnormality in the left breast. She underwent unilateral left diagnostic mammography with tomography and left breast ultrasonography at [facility] on 03/14/2017 showing: breast density category B. There are were multiple hypoechoic masses of varying sizes in the lower inner quadrant of the left breast found sonographically. A dominant mass at the 7:30 position measuring 0.9 x 0.9 x 0.8 cm near the inframammary fold. Another mass at the 7:30 slightly deep and lateral measuring 0.4 x 0.3 cm. At 8 o'clock position, near the  medial margin of the breast is a second dominant mass measuring 0.8 x 0.8 x 0.9 cm. Slightly medial to this is a similar-appearing hypoechoic mass measuring 0.4 x 0.4 x 0.5 cm. Immediately lateral to the dominant mass at 8:00 position, located 12 cm from the nipple, is a 0.4 x 0.4 x 0.5 cm mass. Slightly more superficially at 8 o'clock position 12 cm from the nipple is an irregular hypoechoic mass measuring 0.7 cm maximum diameter. Ultrasound of the left axilla was negative for lymphadenopathy.   Accordingly on 03/28/2017 she proceeded to biopsy of the left breast area in question. The pathology from this procedure showed ([id number]): At the 8 o'clock position, invasive ductal carcinoma with papillary features. Prognostic indicators significant for: estrogen receptor, 100% positive and progesterone receptor, 100% positive, both with strong staining intensity. Proliferation marker Ki67 at 2%. HER2 not amplified with ratios HER2/CEP17 signals 1.13 and average HER2 copies per cell 1.80  On 04/10/2017 the patient had bilateral breast MRI, showing an area up to 5.4 cm including 2 separate masses and an area of non-masslike enhancement.  The mass that was previously biopsied is the more posterior one, near the inframammary fold.  The more anterior mass has not been biopsied and if lumpectomy is considered biopsying that mass would allow the entire span of abnormal enhancement to be included in the lumpectomy specimen.  This second MRI guided biopsy has been scheduled for 04/17/2017.  The right breast was unremarkable and there were no abnormal looking lymph nodes  The patient's subsequent history is as detailed below.   PAST MEDICAL HISTORY: Past Medical History:  Diagnosis Date   Cancer (HCC)    Cataracts, bilateral  Complication of anesthesia    Hyperlipidemia    Hypertension    Personal history of radiation therapy    left breast 2019   PONV (postoperative nausea and vomiting)     PAST  SURGICAL HISTORY: Past Surgical History:  Procedure Laterality Date   BREAST BIOPSY Left 03/28/2017   BREAST BIOPSY Left 04/13/2017   BREAST LUMPECTOMY Left 05/10/2017   BREAST LUMPECTOMY WITH RADIOACTIVE SEED AND SENTINEL LYMPH NODE BIOPSY Left 05/10/2017   Procedure: BREAST LUMPECTOMY WITH RADIOACTIVE SEED X2 AND SENTINEL LYMPH NODE BIOPSY;  Surgeon: Claud Kelp, MD;  Location: [facility] OR;  Service: General;  Laterality: Left;   CESAREAN SECTION     FRACTURE SURGERY Right 2008   right leg fracture s/p pinning    FAMILY HISTORY Family History  Problem Relation Age of Onset   Heart attack Father    Breast cancer Cousin        maternal first cousin   Breast cancer Cousin        maternal first cousin   Cancer Neg Hx    The patient's father died at age 74 due to several heart attacks and stokes. The patient's mother passed at age 13 due to old age. The patient has 4 brothers and 2 sisters. She denies a family history of breast or ovarian cancer.   GYNECOLOGIC HISTORY:  No LMP recorded. Patient is postmenopausal. Menarche: 68 years old Age at first live birth: 68 years old The patient is GX P2 LMP: at age 63 She took low dose oral contraceptive pills briefly after her 1st birth, with no complications. She had bilateral tubal ligation after her 2nd birth due to pregnancy complications. She retained her ovaries. She did not take HRT.    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 Her husband passed away some years ago due to cancer (lymphoma). She lives with her significant other of 7+ years, Rayna Sexton, who is retired from being a Chartered certified accountant. Rayna Sexton also helps with farm work. The patient's oldest son, [name] is a Optician, dispensing at [employer]. The patient's second son, [name], was a Games developer but is now a Merchandiser, retail and lives near the patient's farm. The patient has 7 grandchildren.  She has  one [employer] and a 120 pound [employer], that she obtained after 3 people broke into 1 of her trucks.  She also has multiple chickens and 4 peacocks. She does not attend a church.      ADVANCED DIRECTIVES:    HEALTH MAINTENANCE: Social History   Tobacco Use   Smoking status: Never   Smokeless tobacco: Never  Vaping Use   Vaping status: Never Used  Substance Use Topics   Alcohol use: No   Drug use: No     Colonoscopy: never  PAP: [place]/ not UTD  Bone density: 09/2017, -0.8    Allergies  Allergen Reactions   Penicillins Rash and Other (See Comments)    Has patient had a PCN reaction causing immediate rash, facial/tongue/throat swelling, SOB or lightheadedness with hypotension: Unknown Has patient had a PCN reaction causing severe rash involving mucus membranes or skin necrosis: Unknown Has patient had a PCN reaction that required hospitalization: Unknown Has patient had a PCN reaction occurring within the last 10 years: No If all of the above answers are "NO", then may proceed with Cephalosporin use.    Sulfa Antibiotics Rash  Current Outpatient Medications  Medication Sig Dispense Refill   acetaminophen (TYLENOL) 500 MG tablet Take 1,000 mg by mouth every 6 (six) hours as needed for moderate pain or headache.      anastrozole (ARIMIDEX) 1 MG tablet Take 1 tablet by mouth once daily 30 tablet 0   bisoprolol-hydrochlorothiazide (ZIAC) 10-6.25 MG tablet Take 1 tablet by mouth daily.  1   cholecalciferol (VITAMIN D3) 25 MCG (1000 UNIT) tablet Take 1 tablet (1,000 Units total) by mouth daily. (Patient not taking: No sig reported) 100 tablet 3   lisinopril (ZESTRIL) 10 MG tablet Take 1 tablet (10 mg total) by mouth daily.     Olopatadine HCl 0.2 % SOLN Apply 1 drop to eye daily. 2.5 mL 0   Omega-3 Fatty Acids (FISH OIL) 1200 MG CAPS Take 2,400 mg by mouth daily before lunch.      vitamin C (ASCORBIC ACID) 500 MG tablet Take 500 mg by mouth daily  before lunch.      vitamin E 400 UNIT capsule Take 400 Units by mouth daily before lunch.     No current facility-administered medications for this visit.    OBJECTIVE: White woman who appears well  Vitals:   04/24/23 1146  BP: (!) 169/65  Pulse: 73  Resp: 16  Temp: 98.3 F (36.8 C)  SpO2: 98%    Wt Readings from Last 3 Encounters:  04/24/23 224 lb 14.4 oz (102 kg)  04/20/22 237 lb 4.8 oz (107.6 kg)  04/14/21 240 lb 9.6 oz (109.1 kg)   Body mass index is 34.2 kg/m.    ECOG FS:1 - Symptomatic but completely ambulatory  Sclerae unicteric, EOMs intact Wearing a mask No cervical or supraclavicular adenopathy Lungs no rales or rhonchi Heart regular rate and rhythm Abd soft, nontender, positive bowel sounds MSK no focal spinal tenderness, no upper extremity lymphedema Neuro: nonfocal, well oriented, appropriate affect Breasts: Bilateral breasts inspected examined. No palpable regional adenopathy Left breast s/p radiation changes.  LAB RESULTS:  CMP     Component Value Date/Time   NA 141 04/20/2022 1022   K 3.2 (L) 04/20/2022 1022   CL 105 04/20/2022 1022   CO2 29 04/20/2022 1022   GLUCOSE 117 (H) 04/20/2022 1022   BUN 17 04/20/2022 1022   CREATININE 0.94 04/20/2022 1022   CALCIUM 9.7 04/20/2022 1022   PROT 7.4 04/20/2022 1022   ALBUMIN 4.2 04/20/2022 1022   AST 17 04/20/2022 1022   ALT 12 04/20/2022 1022   ALKPHOS 60 04/20/2022 1022   BILITOT 2.4 (H) 04/20/2022 1022   GFRNONAA >60 04/20/2022 1022   GFRAA >60 04/15/2019 0952   GFRAA >60 12/28/2017 0941    No results found for: "TOTALPROTELP", "ALBUMINELP", "A1GS", "A2GS", "BETS", "BETA2SER", "GAMS", "MSPIKE", "SPEI"  No results found for: "KPAFRELGTCHN", "LAMBDASER", "KAPLAMBRATIO"  Lab Results  Component Value Date   WBC 10.3 04/20/2022   NEUTROABS 7.3 04/20/2022   HGB 12.6 04/20/2022   HCT 36.8 04/20/2022   MCV 96.8 04/20/2022   PLT 233 04/20/2022   No results found for: "LABCA2"  No components  found for: "ZOXWRU045"  No results for input(s): "INR" in the last 168 hours.  No results found for: "LABCA2"  No results found for: "WUJ811"  No results found for: "CAN125"  No results found for: "CAN153"  No results found for: "CA2729"  No components found for: "HGQUANT"  No results found for: "CEA1", "CEA" / No results found for: "CEA1", "CEA"   No results found for: "AFPTUMOR"  No results found for: "CHROMOGRNA"  No results found for: "HGBA", "HGBA2QUANT", "HGBFQUANT", "HGBSQUAN" (Hemoglobinopathy evaluation)   No results found for: "LDH"  No results found for: "IRON", "TIBC", "IRONPCTSAT" (Iron and TIBC)  No results found for: "FERRITIN"  Urinalysis    Component Value Date/Time   COLORURINE YELLOW 07/29/2017 1231   APPEARANCEUR CLEAR 07/29/2017 1231   LABSPEC 1.010 07/29/2017 1231   PHURINE 5.0 07/29/2017 1231   GLUCOSEU NEGATIVE 07/29/2017 1231   HGBUR NEGATIVE 07/29/2017 1231   BILIRUBINUR NEGATIVE 07/29/2017 1231   KETONESUR NEGATIVE 07/29/2017 1231   PROTEINUR NEGATIVE 07/29/2017 1231   NITRITE NEGATIVE 07/29/2017 1231   LEUKOCYTESUR NEGATIVE 07/29/2017 1231    STUDIES: MM 3D SCREENING MAMMOGRAM BILATERAL BREAST Result Date: 04/14/2023 CLINICAL DATA:  Screening. EXAM: DIGITAL SCREENING BILATERAL MAMMOGRAM WITH TOMOSYNTHESIS AND CAD TECHNIQUE: Bilateral screening digital craniocaudal and mediolateral oblique mammograms were obtained. Bilateral screening digital breast tomosynthesis was performed. The images were evaluated with computer-aided detection. COMPARISON:  Previous exam(s). ACR Breast Density Category b: There are scattered areas of fibroglandular density. FINDINGS: There are no findings suspicious for malignancy. IMPRESSION: No mammographic evidence of malignancy. A result letter of this screening mammogram will be mailed directly to the patient. RECOMMENDATION: Screening mammogram in one year. (Code:SM-B-01Y) BI-RADS CATEGORY  1: Negative.  Electronically Signed   By: Ted Mcalpine M.D.   On: 04/14/2023 08:34     ELIGIBLE FOR AVAILABLE RESEARCH PROTOCOL: no  ASSESSMENT: 68 y.o.  [place] woman status post left breast lower inner quadrant biopsy 03/28/2017 for a clinical mT1b N0, stage I invasive ductal carcinoma, grade 2, estrogen and progesterone receptor strongly positive, with no HER-2 amplification, and an MIB-1 of 2%.  (1) status post left lumpectomy and sentinel lymph node sampling 05/10/2017 for an mpT2 pN0, stage IB invasive ductal carcinoma, grade 1, with negative margins.  (a) a total of 3 sentinel lymph nodes were removed  (2) The Oncotype DX score was 14, predicting a risk of outside the breast recurrence over the next 10 years of 4% if the patient's only systemic therapy is tamoxifen for 5 years.  It also predicts no significant benefit from chemotherapy.  (3) adjuvant radiation completed 07/21/2017  (4) anastrozole started 07/24/2017  (a) bone density 10/03/2017 showed a T score of -0.8 (normal).  (b) bone density 04/08/2020 shows a T score of -1.1   PLAN:    Total encounter time 30 minutes.*  *Total Encounter Time as defined by the Centers for Medicare and Medicaid Services includes, in addition to the face-to-face time of a patient visit (documented in the note above) non-face-to-face time: obtaining and reviewing outside history, ordering and reviewing medications, tests or procedures, care coordination (communications with other health care professionals or caregivers) and documentation in the medical record.

## 2023-07-05 ENCOUNTER — Other Ambulatory Visit: Payer: Self-pay

## 2023-07-05 ENCOUNTER — Ambulatory Visit: Payer: Self-pay | Admitting: Nurse Practitioner

## 2023-07-05 ENCOUNTER — Encounter: Payer: Self-pay | Admitting: Emergency Medicine

## 2023-07-05 ENCOUNTER — Ambulatory Visit
Admission: EM | Admit: 2023-07-05 | Discharge: 2023-07-05 | Disposition: A | Discharge status: Home / Self Care | Attending: Nurse Practitioner | Admitting: Nurse Practitioner

## 2023-07-05 ENCOUNTER — Ambulatory Visit (HOSPITAL_COMMUNITY)
Admission: RE | Admit: 2023-07-05 | Discharge: 2023-07-05 | Disposition: A | Discharge status: Home / Self Care | Source: Ambulatory Visit | Attending: Nurse Practitioner | Admitting: Nurse Practitioner

## 2023-07-05 DIAGNOSIS — W19XXXA Unspecified fall, initial encounter: Secondary | ICD-10-CM

## 2023-07-05 DIAGNOSIS — M8580 Other specified disorders of bone density and structure, unspecified site: Secondary | ICD-10-CM | POA: Insufficient documentation

## 2023-07-05 DIAGNOSIS — M25532 Pain in left wrist: Secondary | ICD-10-CM | POA: Insufficient documentation

## 2023-07-05 NOTE — Telephone Encounter (Signed)
 Called patient and discussed xray results - no fracture.  No change to treatment plan at this time.  Patient verbalized understanding and all questions answered.

## 2023-07-05 NOTE — ED Triage Notes (Signed)
 Pt reports slipped and fell on gravel road. Reports left wrist pain ever since.

## 2023-07-05 NOTE — Discharge Instructions (Signed)
 Please go to [facility] department to have x-ray of your left wrist completed.  I will call you later today with the results of the x-ray.  In the meantime, recommend elevating your left hand, applying ice 15 minutes on, 45 minutes off every hour while awake to the area, and taking Tylenol  as needed for pain.

## 2023-07-05 NOTE — ED Provider Notes (Signed)
 [facility]    CSN: [id number] Arrival date & time: 07/05/23  1113      History   Chief Complaint Chief Complaint  Patient presents with   Wrist Pain    HPI Meredith Donaldson is a 68 y.o. female.   Patient presents today with left wrist pain after she fell onto her outstretched left hand yesterday in her gravel driveway.  She denies any open cuts on the hand.  The hand and wrist are bruised and swollen.  She endorses pain with movement of the left wrist or when making a fist.  No pain in the right hand.  No numbness or tingling in the fingertips.  She applied ice last night which seemed to help with pain a little bit.    Past Medical History:  Diagnosis Date   Cancer (HCC)    Cataracts, bilateral    Complication of anesthesia    Hyperlipidemia    Hypertension    Personal history of radiation therapy    left breast 2019   PONV (postoperative nausea and vomiting)     Patient Active Problem List   Diagnosis Date Noted   Gilberts syndrome 10/08/2018   Malignant neoplasm of lower-inner quadrant of left breast in female, estrogen receptor positive (HCC) 04/12/2017    Past Surgical History:  Procedure Laterality Date   BREAST BIOPSY Left 03/28/2017   BREAST BIOPSY Left 04/13/2017   BREAST LUMPECTOMY Left 05/10/2017   BREAST LUMPECTOMY WITH RADIOACTIVE SEED AND SENTINEL LYMPH NODE BIOPSY Left 05/10/2017   Procedure: BREAST LUMPECTOMY WITH RADIOACTIVE SEED X2 AND SENTINEL LYMPH NODE BIOPSY;  Surgeon: Boyce Byes, MD;  Location: [facility] OR;  Service: General;  Laterality: Left;   CESAREAN SECTION     FRACTURE SURGERY Right 2008   right leg fracture s/p pinning   MULTIPLE TOOTH EXTRACTIONS     x3    OB History   No obstetric history on file.      Home Medications    Prior to Admission medications   Medication Sig Start Date End Date Taking? Authorizing Provider  acetaminophen  (TYLENOL ) 500 MG tablet Take 1,000 mg by mouth every 6 (six) hours as needed  for moderate pain or headache.     [provider]  anastrozole  (ARIMIDEX ) 1 MG tablet Take 1 tablet by mouth once daily 06/22/22   Iruku, Praveena, MD  bisoprolol-hydrochlorothiazide (ZIAC) 10-6.25 MG tablet Take 1 tablet by mouth daily. 12/08/15   [provider]  cholecalciferol (VITAMIN D3) 25 MCG (1000 UNIT) tablet Take 1 tablet (1,000 Units total) by mouth daily. Patient not taking: No sig reported 04/14/20   Magrinat, Gustav C, MD  lisinopril (ZESTRIL) 10 MG tablet Take 1 tablet (10 mg total) by mouth daily. 04/14/20   Magrinat, Gustav C, MD  Olopatadine  HCl 0.2 % SOLN Apply 1 drop to eye daily. 07/10/21   Afton Albright, MD  Omega-3 Fatty Acids (FISH OIL) 1200 MG CAPS Take 2,400 mg by mouth daily before lunch.     [provider]  vitamin C (ASCORBIC ACID) 500 MG tablet Take 500 mg by mouth daily before lunch.     [provider]  vitamin E 400 UNIT capsule Take 400 Units by mouth daily before lunch.    [provider]    Family History Family History  Problem Relation Age of Onset   Heart attack Father    Breast cancer Cousin        maternal first cousin   Breast cancer  Cousin        maternal first cousin   Cancer Neg Hx     Social History Social History   Tobacco Use   Smoking status: Never   Smokeless tobacco: Never  Vaping Use   Vaping status: Never Used  Substance Use Topics   Alcohol use: No   Drug use: No     Allergies   Penicillins and Sulfa antibiotics   Review of Systems Review of Systems Per HPI  Physical Exam Triage Vital Signs ED Triage Vitals  Encounter Vitals Group     BP 07/05/23 1121 (!) 165/75     Systolic BP Percentile --      Diastolic BP Percentile --      Pulse Rate 07/05/23 1121 72     Resp 07/05/23 1121 20     Temp 07/05/23 1121 98.3 F (36.8 C)     Temp Source 07/05/23 1121 Oral     SpO2 07/05/23 1121 93 %     Weight --      Height --      Head Circumference --      Peak Flow --       Pain Score 07/05/23 1124 8     Pain Loc --      Pain Education --      Exclude from Growth Chart --    No data found.  Updated Vital Signs BP (!) 165/75 (BP Location: Right Arm)   Pulse 72   Temp 98.3 F (36.8 C) (Oral)   Resp 20   SpO2 93%   Visual Acuity Right Eye Distance:   Left Eye Distance:   Bilateral Distance:    Right Eye Near:   Left Eye Near:    Bilateral Near:     Physical Exam Vitals and nursing note reviewed.  Constitutional:      General: She is not in acute distress.    Appearance: Normal appearance. She is not toxic-appearing.  HENT:     Mouth/Throat:     Mouth: Mucous membranes are moist.     Pharynx: Oropharynx is clear.  Pulmonary:     Effort: Pulmonary effort is normal. No respiratory distress.  Musculoskeletal:     Comments: Inspection: mild swelling and bruising noted to left lateral wrist dorsally and ventrally; no obvious deformity or redness Palpation: tender to palpation left wrist diffusely as well as base of first and second metacarpals; no obvious deformities palpated ROM: Full ROM to left hand and wrist; patient has pain with flexion and extension of the wrist and when she makes a fist with her left hand Strength: 5/5 bilateral upper extremities Neurovascular: neurovascularly intact in distal bilateral upper extremities  Skin:    General: Skin is warm and dry.     Capillary Refill: Capillary refill takes less than 2 seconds.     Coloration: Skin is not jaundiced or pale.     Findings: No erythema.  Neurological:     Mental Status: She is alert and oriented to person, place, and time.  Psychiatric:        Behavior: Behavior is cooperative.      UC Treatments / Results  Labs (all labs ordered are listed, but only abnormal results are displayed) Labs Reviewed - No data to display  EKG   Radiology DG Wrist Complete Left Result Date: 07/05/2023 CLINICAL DATA:  Fall and left wrist pain. EXAM: LEFT WRIST - COMPLETE 3+ VIEW  COMPARISON:  None Available. FINDINGS: There is no acute  fracture or dislocation. The bones are osteopenic. There is mild calcification of the TFCC. The soft tissues are unremarkable. IMPRESSION: 1. No acute fracture or dislocation. 2. Osteopenia. Electronically Signed   By: Angus Bark M.D.   On: 07/05/2023 12:17    Procedures Procedures (including critical care time)  Medications Ordered in UC Medications - No data to display  Initial Impression / Assessment and Plan / UC Course  I have reviewed the triage vital signs and the nursing notes.  Pertinent labs & imaging results that were available during my care of the patient were reviewed by me and considered in my medical decision making (see chart for details).   Patient is mildly hypertensive in urgent care today, otherwise vital signs are stable.  1. Fall, initial encounter 2. Left wrist pain Xray imaging will be obtained on an outpatient basis due to lack of resource in urgent care setting today; will contact patient with results Supportive care discussed in the meantime; ACE wrap applied and discussed rest, ice, elevation, Tylenol  for pain  Update: Wrist x-ray is negative.  No change to treatment plan.  I called patient and made her aware.  All questions answered.  The patient was given the opportunity to ask questions.  All questions answered to their satisfaction.  The patient is in agreement to this plan.   Final Clinical Impressions(s) / UC Diagnoses   Final diagnoses:  Fall, initial encounter  Left wrist pain     Discharge Instructions      Please go to [facility] department to have x-ray of your left wrist completed.  I will call you later today with the results of the x-ray.  In the meantime, recommend elevating your left hand, applying ice 15 minutes on, 45 minutes off every hour while awake to the area, and taking Tylenol  as needed for pain.  ED Prescriptions   None    PDMP not reviewed  this encounter.   Wilhemena Harbour, NP 07/05/23 1241

## 2024-02-29 ENCOUNTER — Encounter: Payer: Self-pay | Admitting: Gastroenterology

## 2024-04-04 ENCOUNTER — Ambulatory Visit: Admitting: Gastroenterology

## 2024-04-12 ENCOUNTER — Ambulatory Visit

## 2024-04-25 ENCOUNTER — Ambulatory Visit: Admitting: Hematology and Oncology
